# Patient Record
Sex: Female | Born: 1985 | Race: White | Hispanic: No | Marital: Married | Smoking: Current every day smoker
Health system: Southern US, Community
[De-identification: ages and names within clinical notes are randomized; demographics above are authoritative.]

## PROBLEM LIST (undated history)

## (undated) ENCOUNTER — Inpatient Hospital Stay (HOSPITAL_COMMUNITY): Payer: Self-pay

## (undated) DIAGNOSIS — L732 Hidradenitis suppurativa: Secondary | ICD-10-CM

## (undated) DIAGNOSIS — Z8619 Personal history of other infectious and parasitic diseases: Secondary | ICD-10-CM

## (undated) DIAGNOSIS — R51 Headache: Secondary | ICD-10-CM

## (undated) DIAGNOSIS — E119 Type 2 diabetes mellitus without complications: Secondary | ICD-10-CM

## (undated) DIAGNOSIS — Z789 Other specified health status: Secondary | ICD-10-CM

## (undated) DIAGNOSIS — Z9889 Other specified postprocedural states: Secondary | ICD-10-CM

## (undated) DIAGNOSIS — R112 Nausea with vomiting, unspecified: Secondary | ICD-10-CM

---

## 2013-05-29 ENCOUNTER — Ambulatory Visit: Payer: Self-pay | Admitting: Obstetrics

## 2013-12-05 ENCOUNTER — Emergency Department (HOSPITAL_COMMUNITY)
Admission: EM | Admit: 2013-12-05 | Discharge: 2013-12-05 | Disposition: A | Discharge status: Home / Self Care | Payer: Medicaid Other | Attending: Emergency Medicine | Admitting: Emergency Medicine

## 2013-12-05 ENCOUNTER — Encounter (HOSPITAL_COMMUNITY): Payer: Self-pay | Admitting: Emergency Medicine

## 2013-12-05 DIAGNOSIS — Z880 Allergy status to penicillin: Secondary | ICD-10-CM | POA: Insufficient documentation

## 2013-12-05 DIAGNOSIS — R6884 Jaw pain: Secondary | ICD-10-CM

## 2013-12-05 DIAGNOSIS — F17200 Nicotine dependence, unspecified, uncomplicated: Secondary | ICD-10-CM | POA: Insufficient documentation

## 2013-12-05 DIAGNOSIS — Z79899 Other long term (current) drug therapy: Secondary | ICD-10-CM | POA: Insufficient documentation

## 2013-12-05 NOTE — Discharge Instructions (Signed)
°  Patients with Medicaid: [facility] °[address], [phone] °[address], [phone] ° °If unable to pay, or uninsured, contact [facility] ([phone]) or [facility] ([phone] in [place], [phone] in [place]) to become qualified for the adult dental clinic ° °Other Low-Cost Community Dental Services: °[facility]- [address], [place], [place], [address] °   [phone], Ext. [phone] °   2nd and 4th Thursday of the month at 6:30am °   10 clients each day by appointment, can sometimes see walk-in patients if someone does not show for an appointment °[facility]- [address], [place], [place], [address] °   [phone] °[facility]- [address], [place], [place], [address] °   [phone] ° °[facility]- [phone] °[facility]- [phone] °[facility]- [phone] ° °

## 2013-12-05 NOTE — ED Provider Notes (Signed)
CSN: [id number]     Arrival date & time 12/05/13  1609 History  This chart was scribed for non-physician practitioner Junius Finner, PA-C working with Geoffery Lyons, MD by Joaquin Music, ED Scribe. This patient was seen in room TR05C/TR05C and the patient's care was started at 4:30 PM .   Chief Complaint  Patient presents with  . Jaw Pain   The history is provided by the patient. No language interpreter was used.   HPI Comments: Peggy Clark is a 28 y.o. female who presents to the Emergency Department complaining of lower R jaw pain that began 3 days ago. Pt states she suspects she may have "clinched her teeth too hard" in her sleep. She states she suspects she may have dislocated her jaw due to hearing a popping sound when opening and closing mouth. Pt states her pain exasperates when she clinches her mouth down. She states her pain is 10/10 when clinching down and states the pain is sharp. She states she has tried OTC Ibuprofen without relief. Pt denies having a dentist and states "she has not been to the dentist in a few years". Pt denies any prior injuries to jaw prior to pain. Pt denies any recent injuries.  Pt states she is currently 8 weeks and 2 days pregnant and concerned for child if X-ray is needed.  History reviewed. No pertinent past medical history. History reviewed. No pertinent past surgical history. No family history on file. History  Substance Use Topics  . Smoking status: Current Every Day Smoker  . Smokeless tobacco: Not on file  . Alcohol Use: No   OB History   Grav Para Term Preterm Abortions TAB SAB Ect Mult Living   1              Review of Systems  HENT:       R sided jaw pain  All other systems reviewed and are negative.    Allergies  Amoxicillin and Penicillins  Home Medications   Current Outpatient Rx  Name  Route  Sig  Dispense  Refill  . Prenatal Vit-Fe Fumarate-FA (PRENATAL MULTIVITAMIN) TABS tablet   Oral   Take 1 tablet by mouth  daily at 12 noon.           BP 132/64  Pulse 79  Temp(Src) 97.7 F (36.5 C) (Oral)  Resp 24  Ht 5\' 5"  (1.651 m)  Wt 126 lb (57.153 kg)  BMI 20.97 kg/m2  SpO2 100%  Physical Exam  Nursing note and vitals reviewed. Constitutional: She is oriented to person, place, and time. She appears well-developed and well-nourished.  HENT:  Head: Normocephalic and atraumatic.    Nose: Nose normal.  Mouth/Throat: Uvula is midline, oropharynx is clear and moist and mucous membranes are normal.  Tenderness over TMJ. Able to open and close mouth fully. No obvious dental abnormality or abscess.   Eyes: EOM are normal.  Neck: Normal range of motion.  Cardiovascular: Normal rate.   Pulmonary/Chest: Effort normal.  Musculoskeletal: Normal range of motion.  Neurological: She is alert and oriented to person, place, and time.  Skin: Skin is warm and dry.  Psychiatric: She has a normal mood and affect. Her behavior is normal.    ED Course  Procedures  DIAGNOSTIC STUDIES: Oxygen Saturation is 100% on RA, normal by my interpretation.    COORDINATION OF CARE: 4:33 PM-Discussed treatment plan which includes discharge pt with Tylenol. Pt agreed to plan.   Labs Review Labs Reviewed - No data  to display Imaging Review No results found.  EKG Interpretation   None      MDM   1. Jaw pain    Pt presenting with right sided jaw pain. Admits to grinding her teeth. Denies direct trauma such as a fall or being hit in the jaw. Has not f/u with dentist. Has tried ibuprofen w/o much relief. On exam, jaw does not appear dislocated. Discussed pt with Dr. Judd Lienelo, no imaging necessary at this time. Advised pt to use acetaminophen and f/u with dentist. Dental guide provided. Return precautions provided. Pt verbalized understanding and agreement with tx plan.   I personally performed the services described in this documentation, which was scribed in my presence. The recorded information has been reviewed and  is accurate.    Junius Finnerrin O'Malley, PA-C 12/05/13 1713

## 2013-12-05 NOTE — ED Notes (Signed)
The pt is c/o rt jaw pain for 4 days.  She thinks she  Grinded her teeth and dislocated her jaw.  Not able to move her jaw as usual

## 2013-12-05 NOTE — ED Provider Notes (Signed)
Medical screening examination/treatment/procedure(s) were performed by non-physician practitioner and as supervising physician I was immediately available for consultation/collaboration.     Geoffery Lyonsouglas Samul Mcinroy, MD 12/05/13 [phone]

## 2013-12-05 NOTE — ED Notes (Signed)
She is a few weeks preg

## 2013-12-16 LAB — OB RESULTS CONSOLE HIV ANTIBODY (ROUTINE TESTING): HIV: NONREACTIVE

## 2013-12-16 LAB — OB RESULTS CONSOLE ANTIBODY SCREEN: Antibody Screen: NEGATIVE

## 2013-12-16 LAB — OB RESULTS CONSOLE ABO/RH: RH Type: POSITIVE

## 2013-12-16 LAB — OB RESULTS CONSOLE RUBELLA ANTIBODY, IGM: Rubella: IMMUNE

## 2013-12-16 LAB — OB RESULTS CONSOLE HEPATITIS B SURFACE ANTIGEN: HEP B S AG: NEGATIVE

## 2013-12-16 LAB — OB RESULTS CONSOLE GC/CHLAMYDIA
Chlamydia: NEGATIVE
GC PROBE AMP, GENITAL: NEGATIVE

## 2014-04-08 LAB — OB RESULTS CONSOLE RPR: RPR: NONREACTIVE

## 2014-04-08 LAB — OB RESULTS CONSOLE HIV ANTIBODY (ROUTINE TESTING): HIV: NONREACTIVE

## 2014-04-29 ENCOUNTER — Encounter (HOSPITAL_COMMUNITY): Payer: Self-pay | Admitting: Emergency Medicine

## 2014-04-29 ENCOUNTER — Emergency Department (HOSPITAL_COMMUNITY)
Admission: EM | Admit: 2014-04-29 | Discharge: 2014-04-29 | Discharge status: Against Medical Advice | Payer: Medicaid Other | Attending: Emergency Medicine | Admitting: Emergency Medicine

## 2014-04-29 DIAGNOSIS — O99330 Smoking (tobacco) complicating pregnancy, unspecified trimester: Secondary | ICD-10-CM | POA: Insufficient documentation

## 2014-04-29 DIAGNOSIS — O9989 Other specified diseases and conditions complicating pregnancy, childbirth and the puerperium: Secondary | ICD-10-CM | POA: Insufficient documentation

## 2014-04-29 DIAGNOSIS — R109 Unspecified abdominal pain: Secondary | ICD-10-CM | POA: Insufficient documentation

## 2014-04-29 LAB — CBC WITH DIFFERENTIAL/PLATELET
BASOS PCT: 0 % (ref 0–1)
Basophils Absolute: 0 10*3/uL (ref 0.0–0.1)
Eosinophils Absolute: 0.1 10*3/uL (ref 0.0–0.7)
Eosinophils Relative: 1 % (ref 0–5)
HEMATOCRIT: 30 % — AB (ref 36.0–46.0)
HEMOGLOBIN: 10.3 g/dL — AB (ref 12.0–15.0)
Lymphocytes Relative: 26 % (ref 12–46)
Lymphs Abs: 3.1 10*3/uL (ref 0.7–4.0)
MCH: 32 pg (ref 26.0–34.0)
MCHC: 34.3 g/dL (ref 30.0–36.0)
MCV: 93.2 fL (ref 78.0–100.0)
MONO ABS: 0.7 10*3/uL (ref 0.1–1.0)
MONOS PCT: 6 % (ref 3–12)
Neutro Abs: 7.9 10*3/uL — ABNORMAL HIGH (ref 1.7–7.7)
Neutrophils Relative %: 67 % (ref 43–77)
Platelets: 300 10*3/uL (ref 150–400)
RBC: 3.22 MIL/uL — ABNORMAL LOW (ref 3.87–5.11)
RDW: 12.5 % (ref 11.5–15.5)
WBC: 11.8 10*3/uL — ABNORMAL HIGH (ref 4.0–10.5)

## 2014-04-29 LAB — COMPREHENSIVE METABOLIC PANEL
ALT: 9 U/L (ref 0–35)
AST: 14 U/L (ref 0–37)
Albumin: 3 g/dL — ABNORMAL LOW (ref 3.5–5.2)
Alkaline Phosphatase: 84 U/L (ref 39–117)
BUN: 5 mg/dL — AB (ref 6–23)
CHLORIDE: 100 meq/L (ref 96–112)
CO2: 21 mEq/L (ref 19–32)
CREATININE: 0.39 mg/dL — AB (ref 0.50–1.10)
Calcium: 9.5 mg/dL (ref 8.4–10.5)
Glucose, Bld: 81 mg/dL (ref 70–99)
Potassium: 3.8 mEq/L (ref 3.7–5.3)
Sodium: 136 mEq/L — ABNORMAL LOW (ref 137–147)
Total Protein: 6.9 g/dL (ref 6.0–8.3)

## 2014-04-29 LAB — URINE MICROSCOPIC-ADD ON

## 2014-04-29 LAB — URINALYSIS, ROUTINE W REFLEX MICROSCOPIC
Bilirubin Urine: NEGATIVE
Glucose, UA: NEGATIVE mg/dL
Ketones, ur: NEGATIVE mg/dL
LEUKOCYTES UA: NEGATIVE
Nitrite: NEGATIVE
PH: 7 (ref 5.0–8.0)
Protein, ur: NEGATIVE mg/dL
Specific Gravity, Urine: 1.01 (ref 1.005–1.030)
Urobilinogen, UA: 0.2 mg/dL (ref 0.0–1.0)

## 2014-04-29 LAB — LIPASE, BLOOD: LIPASE: 15 U/L (ref 11–59)

## 2014-04-29 NOTE — ED Notes (Signed)
Pt states that she has been having abdominal pain for 3 weeks. Pt states she is [redacted] weeks pregnant and she had a c-section prior to this pregnancy. Pt asked 2 different MD's about pain and was told that it was c-section scar pain. Pt havign pain around umbilicus today constant all day. Pt denies problems with bowels or urine.

## 2014-04-29 NOTE — ED Notes (Signed)
Pt's husband brought pager and BP cuff to nurse first and states they are leaving due to wait.  Encouraged husband for pt to stay to be evaluated and he declined.

## 2014-05-02 ENCOUNTER — Encounter (HOSPITAL_COMMUNITY): Payer: Self-pay | Admitting: *Deleted

## 2014-05-02 ENCOUNTER — Inpatient Hospital Stay (HOSPITAL_COMMUNITY)
Admission: AD | Admit: 2014-05-02 | Discharge: 2014-05-02 | Disposition: A | Discharge status: Home / Self Care | Payer: Medicaid Other | Source: Ambulatory Visit | Attending: Obstetrics and Gynecology | Admitting: Obstetrics and Gynecology

## 2014-05-02 DIAGNOSIS — O9989 Other specified diseases and conditions complicating pregnancy, childbirth and the puerperium: Principal | ICD-10-CM

## 2014-05-02 DIAGNOSIS — R109 Unspecified abdominal pain: Secondary | ICD-10-CM | POA: Insufficient documentation

## 2014-05-02 DIAGNOSIS — O99891 Other specified diseases and conditions complicating pregnancy: Secondary | ICD-10-CM | POA: Insufficient documentation

## 2014-05-02 DIAGNOSIS — O99330 Smoking (tobacco) complicating pregnancy, unspecified trimester: Secondary | ICD-10-CM | POA: Insufficient documentation

## 2014-05-02 LAB — URINE MICROSCOPIC-ADD ON

## 2014-05-02 LAB — URINALYSIS, ROUTINE W REFLEX MICROSCOPIC
BILIRUBIN URINE: NEGATIVE
GLUCOSE, UA: NEGATIVE mg/dL
KETONES UR: NEGATIVE mg/dL
Leukocytes, UA: NEGATIVE
Nitrite: NEGATIVE
PH: 7 (ref 5.0–8.0)
Protein, ur: 100 mg/dL — AB
Specific Gravity, Urine: 1.02 (ref 1.005–1.030)
Urobilinogen, UA: 1 mg/dL (ref 0.0–1.0)

## 2014-05-02 NOTE — Discharge Instructions (Signed)
Abdominal Pain During Pregnancy °Belly (abdominal) pain is common during pregnancy. Most of the time, it is not a serious problem. Other times, it can be a sign that something is wrong with the pregnancy. Always tell your doctor if you have belly pain. °HOME CARE °Monitor your belly pain for any changes. The following actions may help you feel better: °· Do not have sex (intercourse) or put anything in your vagina until you feel better. °· Rest until your pain stops. °· Drink clear fluids if you feel sick to your stomach (nauseous). Do not eat solid food until you feel better. °· Only take medicine as told by your doctor. °· Keep all doctor visits as told. °GET HELP RIGHT AWAY IF:  °· You are bleeding, leaking fluid, or pieces of tissue come out of your vagina. °· You have more pain or cramping. °· You keep throwing up (vomiting). °· You have pain when you pee (urinate) or have blood in your pee. °· You have a fever. °· You do not feel your baby moving as much. °· You feel very weak or feel like passing out. °· You have trouble breathing, with or without belly pain. °· You have a very bad headache and belly pain. °· You have fluid leaking from your vagina and belly pain. °· You keep having watery poop (diarrhea). °· Your belly pain does not go away after resting, or the pain gets worse. °MAKE SURE YOU:  °· Understand these instructions. °· Will watch your condition. °· Will get help right away if you are not doing well or get worse. °Document Released: 10/18/2009 Document Revised: 07/02/2013 Document Reviewed: 05/29/2013 °ExitCare® Patient Information ©2015 [employer]. This information is not intended to replace advice given to you by your health care provider. Make sure you discuss any questions you have with your health care provider. ° °

## 2014-05-02 NOTE — MAU Note (Signed)
I've had severe pain R of belly button for 3wks. Went to [facility] 3 days ago but did not wait to be seen. Mentioned to them at office but no clear answer as to cause.

## 2014-05-02 NOTE — MAU Provider Note (Signed)
History     CSN: [id number]  Arrival date and time: 05/02/14 2047   First Provider Initiated Contact with Patient 05/02/14 2204      Chief Complaint  Patient presents with  . Abdominal Pain   Abdominal Pain Pertinent negatives include no constipation, diarrhea, dysuria, fever, myalgias, nausea or vomiting.    28 yo G2P0101 at 4264w3d wks IUP here with report of Isevere pain R of belly button for 3wks. Pain is described as constant, with worsening at moments.  Typically last 15 minutes when it's at it's peak.  Se[facility] at [facility] 3 days ago but did not wait to be seen. Mentioned to them at office but no clear answer as to cause. Denis vaginal bleeding or leaking of fluid.     History reviewed. No pertinent past medical history.  Past Surgical History  Procedure Laterality Date  . Cesarean section      History reviewed. No pertinent family history.  History  Substance Use Topics  . Smoking status: Current Every Day Smoker  . Smokeless tobacco: Not on file  . Alcohol Use: No    Allergies:  Allergies  Allergen Reactions  . Amoxicillin Anaphylaxis  . Penicillins Anaphylaxis    Prescriptions prior to admission  Medication Sig Dispense Refill  . calcium carbonate (TUMS - DOSED IN MG ELEMENTAL CALCIUM) 500 MG chewable tablet Chew 2 tablets by mouth daily as needed for indigestion or heartburn.      . Prenatal Vit-Fe Fumarate-FA (PRENATAL MULTIVITAMIN) TABS tablet Take 1 tablet by mouth daily.        Review of Systems  Constitutional: Negative for fever and chills.  Gastrointestinal: Positive for abdominal pain. Negative for nausea, vomiting, diarrhea and constipation.  Genitourinary: Negative for dysuria, urgency and flank pain.  Musculoskeletal: Negative for myalgias.  All other systems reviewed and are negative.  Physical Exam   Blood pressure 145/72, pulse 99, temperature 98.9 F (37.2 C), resp. rate 20, height 5\' 5"  (1.651 m), weight 70.398 kg (155 lb 3.2  oz).  Physical Exam  Constitutional: She is oriented to person, place, and time. She appears well-developed and well-nourished. No distress.  HENT:  Head: Normocephalic.  Neck: Normal range of motion. Neck supple.  Cardiovascular: Normal rate, regular rhythm and normal heart sounds.   Respiratory: Effort normal and breath sounds normal.  GI: Soft. She exhibits no mass. There is no tenderness. No hernia. Hernia confirmed negative in the ventral area.    Genitourinary: No bleeding around the vagina.  Musculoskeletal: Normal range of motion.  Neurological: She is alert and oriented to person, place, and time.  Skin: Skin is warm and dry.   FHR 130's, +accels Toco - none  MAU Course  Procedures  Results for orders placed during the hospital encounter of 05/02/14 (from the past 24 hour(s))  URINALYSIS, ROUTINE W REFLEX MICROSCOPIC     Status: Abnormal   Collection Time    05/02/14  9:10 PM      Result Value Ref Range   Color, Urine YELLOW  YELLOW   APPearance CLEAR  CLEAR   Specific Gravity, Urine 1.020  1.005 - 1.030   pH 7.0  5.0 - 8.0   Glucose, UA NEGATIVE  NEGATIVE mg/dL   Hgb urine dipstick SMALL (*) NEGATIVE   Bilirubin Urine NEGATIVE  NEGATIVE   Ketones, ur NEGATIVE  NEGATIVE mg/dL   Protein, ur 366100 (*) NEGATIVE mg/dL   Urobilinogen, UA 1.0  0.0 - 1.0 mg/dL   Nitrite NEGATIVE  NEGATIVE   Leukocytes, UA NEGATIVE  NEGATIVE  URINE MICROSCOPIC-ADD ON     Status: Abnormal   Collection Time    05/02/14  9:10 PM      Result Value Ref Range   Squamous Epithelial / LPF FEW (*) RARE   WBC, UA 3-6  <3 WBC/hpf   RBC / HPF 3-6  <3 RBC/hpf   Bacteria, UA MANY (*) RARE   Urine-Other MUCOUS PRESENT     2240 Consulted with Dr. Forestine Naeichardson > reviewed vitals, HPI/exam/OB hx > discharge home with reassurance  Assessment and Plan   28 yo G2P0101 at 2244w3d wks IUP Abdominal Wall Pain  Plan: Discharge to home Provide reassurance Follow-up in office  prn [facility],Avielle Clark 05/02/2014, 10:07 PM

## 2014-05-23 ENCOUNTER — Encounter (HOSPITAL_COMMUNITY): Payer: Self-pay | Admitting: *Deleted

## 2014-05-23 ENCOUNTER — Inpatient Hospital Stay (HOSPITAL_COMMUNITY)
Admission: AD | Admit: 2014-05-23 | Discharge: 2014-05-23 | Disposition: A | Discharge status: Home / Self Care | Payer: Medicaid Other | Source: Ambulatory Visit | Attending: Obstetrics and Gynecology | Admitting: Obstetrics and Gynecology

## 2014-05-23 DIAGNOSIS — O4700 False labor before 37 completed weeks of gestation, unspecified trimester: Secondary | ICD-10-CM | POA: Diagnosis not present

## 2014-05-23 DIAGNOSIS — N858 Other specified noninflammatory disorders of uterus: Secondary | ICD-10-CM

## 2014-05-23 DIAGNOSIS — N859 Noninflammatory disorder of uterus, unspecified: Secondary | ICD-10-CM | POA: Diagnosis not present

## 2014-05-23 HISTORY — DX: Other specified health status: Z78.9

## 2014-05-23 LAB — URINALYSIS, ROUTINE W REFLEX MICROSCOPIC
Bilirubin Urine: NEGATIVE
Glucose, UA: NEGATIVE mg/dL
Ketones, ur: NEGATIVE mg/dL
Leukocytes, UA: NEGATIVE
Nitrite: NEGATIVE
PROTEIN: 100 mg/dL — AB
Specific Gravity, Urine: 1.025 (ref 1.005–1.030)
Urobilinogen, UA: 0.2 mg/dL (ref 0.0–1.0)
pH: 6 (ref 5.0–8.0)

## 2014-05-23 LAB — URINE MICROSCOPIC-ADD ON

## 2014-05-23 LAB — FETAL FIBRONECTIN: FETAL FIBRONECTIN: NEGATIVE

## 2014-05-23 NOTE — MAU Note (Signed)
Patient presents with complaint of contractions 6 minutes apart since 1900 today and states her mucus plug fell out 3 days ago.

## 2014-05-23 NOTE — MAU Provider Note (Signed)
History     CSN: [id number]  Arrival date and time: 05/23/14 2024   None     Chief Complaint  Patient presents with  . Abdominal Pain   HPI This is a 28 y.o. female at 1513w3d who presents with c/o contractions. States mucous plug came out several days ago. No leaking or bleeding now.   RN Note;  Patient presents with complaint of contractions 6 minutes apart since 1900 today and states her mucus plug fell out 3 days ago.       OB History   Grav Para Term Preterm Abortions TAB SAB Ect Mult Living   2 1  1      1       Past Medical History  Diagnosis Date  . Medical history non-contributory     Past Surgical History  Procedure Laterality Date  . Cesarean section      History reviewed. No pertinent family history.  History  Substance Use Topics  . Smoking status: Current Every Day Smoker  . Smokeless tobacco: Current User     Comment: Vapor cigarettes  . Alcohol Use: No    Allergies:  Allergies  Allergen Reactions  . Amoxicillin Anaphylaxis  . Penicillins Anaphylaxis    Prescriptions prior to admission  Medication Sig Dispense Refill  . calcium carbonate (TUMS - DOSED IN MG ELEMENTAL CALCIUM) 500 MG chewable tablet Chew 2 tablets by mouth daily as needed for indigestion or heartburn.      . Prenatal Vit-Fe Fumarate-FA (PRENATAL MULTIVITAMIN) TABS tablet Take 1 tablet by mouth daily.        Review of Systems  Constitutional: Negative for fever, chills and malaise/fatigue.  Gastrointestinal: Positive for abdominal pain. Negative for nausea, vomiting, diarrhea and constipation.  Neurological: Negative for dizziness.   Physical Exam   Blood pressure 120/67, pulse 95, temperature 98.6 F (37 C), temperature source Oral, resp. rate 18, height 5' 1.5" (1.562 m), weight 71.725 kg (158 lb 2 oz).  Physical Exam  Constitutional: She is oriented to person, place, and time. She appears well-developed and well-nourished. No distress.  HENT:  Head:  Normocephalic.  Cardiovascular: Normal rate.   Respiratory: Effort normal.  GI: Soft. She exhibits no distension and no mass. There is no tenderness. There is no rebound and no guarding.  Genitourinary: Vagina normal. No vaginal discharge found.  Dilation: Closed Effacement (%): Thick Cervical Position: Posterior Station: -3 Exam by:: M.Kiwana Deblasi,CNM   Musculoskeletal: Normal range of motion.  Neurological: She is alert and oriented to person, place, and time.  Skin: Skin is warm and dry.  Psychiatric: She has a normal mood and affect.  FHR reactive, Cat I SOme uterine irritability noted, no contractions FFn sent  MAU Course  Procedures  MDM Results for orders placed during the hospital encounter of 05/23/14 (from the past 72 hour(s))  URINALYSIS, ROUTINE W REFLEX MICROSCOPIC     Status: Abnormal   Collection Time    05/23/14  8:30 PM      Result Value Ref Range   Color, Urine YELLOW  YELLOW   APPearance CLEAR  CLEAR   Specific Gravity, Urine 1.025  1.005 - 1.030   pH 6.0  5.0 - 8.0   Glucose, UA NEGATIVE  NEGATIVE mg/dL   Hgb urine dipstick MODERATE (*) NEGATIVE   Bilirubin Urine NEGATIVE  NEGATIVE   Ketones, ur NEGATIVE  NEGATIVE mg/dL   Protein, ur 696100 (*) NEGATIVE mg/dL   Urobilinogen, UA 0.2  0.0 - 1.0 mg/dL  Nitrite NEGATIVE  NEGATIVE   Leukocytes, UA NEGATIVE  NEGATIVE  URINE MICROSCOPIC-ADD ON     Status: Abnormal   Collection Time    05/23/14  8:30 PM      Result Value Ref Range   Squamous Epithelial / LPF FEW (*) RARE   WBC, UA 0-2  <3 WBC/hpf   RBC / HPF 0-2  <3 RBC/hpf   Bacteria, UA FEW (*) RARE   Urine-Other MUCOUS PRESENT    FETAL FIBRONECTIN     Status: None   Collection Time    05/23/14  9:10 PM      Result Value Ref Range   Fetal Fibronectin NEGATIVE  NEGATIVE     Assessment and Plan  A:  SIUP at [redacted]w[redacted]d       Uterine irritability      No change in cervix      Negative Fetal Fibronectin  P:  Discussed with Dr Senaida Ores       Discharged  home       PTL precautions  [facility] 05/23/2014, 9:01 PM

## 2014-05-23 NOTE — Discharge Instructions (Signed)

## 2014-06-07 ENCOUNTER — Inpatient Hospital Stay (HOSPITAL_COMMUNITY)
Admission: AD | Admit: 2014-06-07 | Discharge: 2014-06-07 | Disposition: A | Discharge status: Home / Self Care | Payer: Medicaid Other | Source: Ambulatory Visit | Attending: Obstetrics and Gynecology | Admitting: Obstetrics and Gynecology

## 2014-06-07 ENCOUNTER — Encounter (HOSPITAL_COMMUNITY): Payer: Self-pay | Admitting: *Deleted

## 2014-06-07 DIAGNOSIS — O4700 False labor before 37 completed weeks of gestation, unspecified trimester: Secondary | ICD-10-CM | POA: Diagnosis not present

## 2014-06-07 DIAGNOSIS — O99330 Smoking (tobacco) complicating pregnancy, unspecified trimester: Secondary | ICD-10-CM | POA: Insufficient documentation

## 2014-06-07 DIAGNOSIS — O99891 Other specified diseases and conditions complicating pregnancy: Secondary | ICD-10-CM | POA: Diagnosis present

## 2014-06-07 DIAGNOSIS — O4703 False labor before 37 completed weeks of gestation, third trimester: Secondary | ICD-10-CM

## 2014-06-07 LAB — COMPREHENSIVE METABOLIC PANEL
ALBUMIN: 2.5 g/dL — AB (ref 3.5–5.2)
ALK PHOS: 114 U/L (ref 39–117)
ALT: 9 U/L (ref 0–35)
AST: 15 U/L (ref 0–37)
Anion gap: 12 (ref 5–15)
BUN: 6 mg/dL (ref 6–23)
CALCIUM: 8.9 mg/dL (ref 8.4–10.5)
CO2: 24 mEq/L (ref 19–32)
CREATININE: 0.46 mg/dL — AB (ref 0.50–1.10)
Chloride: 102 mEq/L (ref 96–112)
GFR calc Af Amer: >90 mL/min (ref >90)
GLUCOSE: 74 mg/dL (ref 70–99)
POTASSIUM: 3.7 meq/L (ref 3.7–5.3)
Sodium: 138 mEq/L (ref 137–147)
Total Bilirubin: <0.2 mg/dL — ABNORMAL LOW (ref 0.3–1.2)
Total Protein: 5.9 g/dL — ABNORMAL LOW (ref 6.0–8.3)

## 2014-06-07 LAB — URINALYSIS, ROUTINE W REFLEX MICROSCOPIC
Bilirubin Urine: NEGATIVE
GLUCOSE, UA: 100 mg/dL — AB
Ketones, ur: 15 mg/dL — AB
LEUKOCYTES UA: NEGATIVE
Nitrite: NEGATIVE
Protein, ur: 100 mg/dL — AB
Specific Gravity, Urine: 1.025 (ref 1.005–1.030)
UROBILINOGEN UA: 1 mg/dL (ref 0.0–1.0)
pH: 6 (ref 5.0–8.0)

## 2014-06-07 LAB — CBC
HEMATOCRIT: 31.5 % — AB (ref 36.0–46.0)
HEMOGLOBIN: 10.8 g/dL — AB (ref 12.0–15.0)
MCH: 32.6 pg (ref 26.0–34.0)
MCHC: 34.3 g/dL (ref 30.0–36.0)
MCV: 95.2 fL (ref 78.0–100.0)
Platelets: 289 10*3/uL (ref 150–400)
RBC: 3.31 MIL/uL — ABNORMAL LOW (ref 3.87–5.11)
RDW: 12.8 % (ref 11.5–15.5)
WBC: 11.5 10*3/uL — ABNORMAL HIGH (ref 4.0–10.5)

## 2014-06-07 LAB — URINE MICROSCOPIC-ADD ON

## 2014-06-07 LAB — URIC ACID: Uric Acid, Serum: 5.6 mg/dL (ref 2.4–7.0)

## 2014-06-07 LAB — LACTATE DEHYDROGENASE: LDH: 156 U/L (ref 94–250)

## 2014-06-07 LAB — AMNISURE RUPTURE OF MEMBRANE (ROM) NOT AT ARMC: Amnisure ROM: NEGATIVE

## 2014-06-07 NOTE — Discharge Instructions (Signed)
Reasons to return to MAU:  1.  Contractions are  5 minutes apart or less, each last 1 minute, these have been going on for 1-2 hours, and you cannot walk or talk during them 2.  You have a large gush of fluid, or a trickle of fluid that will not stop and you have to wear a pad 3.  You have bleeding that is bright red, heavier than spotting--like menstrual bleeding (spotting can be normal in early labor or after a check of your cervix) 4.  You do not feel the baby moving like he/she normally does 5.  Severe headache, pain in your upper right abdomen, or sudden changes to your vision

## 2014-06-07 NOTE — MAU Provider Note (Signed)
Chief Complaint:  Rupture of Membranes   First Provider Initiated Contact with Patient 06/07/14 2033      HPI: Peggy Clark is a 28 y.o. G2P0101 at [redacted]w[redacted]d who presents to maternity admissions reporting leakage of fluid since last night.  She reports she has felt a slow trickle several times since last night, enough to wet her underwear but not requiring a pad or getting onto her clothes.  She also reports intercourse this morning.  She reports good fetal movement, denies regular contractions, vaginal bleeding, vaginal itching/burning, urinary symptoms, h/a, visual disturbances, epigastric pain, dizziness, n/v, or fever/chills.     Past Medical History: Past Medical History  Diagnosis Date  . Medical history non-contributory     Past obstetric history: OB History  Gravida Para Term Preterm AB SAB TAB Ectopic Multiple Living  2 1  1      1     # Outcome Date GA Lbr Len/2nd Weight Sex Delivery Anes PTL Lv  2 CUR           1 PRE 10/16/05 [redacted]w[redacted]d  2.296 kg (5 lb 1 oz) M LTCS   Y      Past Surgical History: Past Surgical History  Procedure Laterality Date  . Cesarean section      Family History: History reviewed. No pertinent family history.  Social History: History  Substance Use Topics  . Smoking status: Current Every Day Smoker  . Smokeless tobacco: Never Used     Comment: Vapor cigarettes  . Alcohol Use: No    Allergies:  Allergies  Allergen Reactions  . Amoxicillin Anaphylaxis  . Penicillins Anaphylaxis    Meds:  No prescriptions prior to admission    ROS: Pertinent findings in history of present illness.  Physical Exam  Blood pressure 131/74, pulse 91, temperature 98.4 F (36.9 C), temperature source Oral, resp. rate 18, height 5' 1.5" (1.562 m), weight 72.576 kg (160 lb), SpO2 100.00%. GENERAL: Well-developed, well-nourished female in no acute distress.  HEENT: normocephalic HEART: normal rate RESP: normal effort ABDOMEN: Soft, non-tender, gravid appropriate  for gestational age EXTREMITIES: Nontender, no edema NEURO: alert and oriented SPECULUM EXAM: Cervix visually closed/thick, negative pooling with cough/valsalva, Amnisure collected   Dilation: Closed Effacement (%): Thick Cervical Position: Posterior (slightly) Station: [employer] Exam by:: Dellie Burns, RN BSN  FHT:  Baseline 145, moderate variability, accelerations present, no decelerations Contractions: None on toco or to palpation   Labs: Results for orders placed during the hospital encounter of 06/07/14 (from the past 24 hour(s))  URINALYSIS, ROUTINE W REFLEX MICROSCOPIC     Status: Abnormal   Collection Time    06/07/14  7:59 PM      Result Value Ref Range   Color, Urine YELLOW  YELLOW   APPearance CLEAR  CLEAR   Specific Gravity, Urine 1.025  1.005 - 1.030   pH 6.0  5.0 - 8.0   Glucose, UA 100 (*) NEGATIVE mg/dL   Hgb urine dipstick MODERATE (*) NEGATIVE   Bilirubin Urine NEGATIVE  NEGATIVE   Ketones, ur 15 (*) NEGATIVE mg/dL   Protein, ur 478 (*) NEGATIVE mg/dL   Urobilinogen, UA 1.0  0.0 - 1.0 mg/dL   Nitrite NEGATIVE  NEGATIVE   Leukocytes, UA NEGATIVE  NEGATIVE  URINE MICROSCOPIC-ADD ON     Status: Abnormal   Collection Time    06/07/14  7:59 PM      Result Value Ref Range   Squamous Epithelial / LPF FEW (*) RARE   WBC, UA  0-2  <3 WBC/hpf   RBC / HPF 3-6  <3 RBC/hpf   Bacteria, UA FEW (*) RARE  AMNISURE RUPTURE OF MEMBRANE (ROM)     Status: None   Collection Time    06/07/14  8:38 PM      Result Value Ref Range   Amnisure ROM NEGATIVE    CBC     Status: Abnormal   Collection Time    06/07/14  9:12 PM      Result Value Ref Range   WBC 11.5 (*) 4.0 - 10.5 K/uL   RBC 3.31 (*) 3.87 - 5.11 MIL/uL   Hemoglobin 10.8 (*) 12.0 - 15.0 g/dL   HCT 16.131.5 (*) 09.636.0 - 04.546.0 %   MCV 95.2  78.0 - 100.0 fL   MCH 32.6  26.0 - 34.0 pg   MCHC 34.3  30.0 - 36.0 g/dL   RDW 40.912.8  81.111.5 - 91.415.5 %   Platelets 289  150 - 400 K/uL  COMPREHENSIVE METABOLIC PANEL     Status:  Abnormal   Collection Time    06/07/14  9:12 PM      Result Value Ref Range   Sodium 138  137 - 147 mEq/L   Potassium 3.7  3.7 - 5.3 mEq/L   Chloride 102  96 - 112 mEq/L   CO2 24  19 - 32 mEq/L   Glucose, Bld 74  70 - 99 mg/dL   BUN 6  6 - 23 mg/dL   Creatinine, Ser 7.820.46 (*) 0.50 - 1.10 mg/dL   Calcium 8.9  8.4 - 95.610.5 mg/dL   Total Protein 5.9 (*) 6.0 - 8.3 g/dL   Albumin 2.5 (*) 3.5 - 5.2 g/dL   AST 15  0 - 37 U/L   ALT 9  0 - 35 U/L   Alkaline Phosphatase 114  39 - 117 U/L   Total Bilirubin <0.2 (*) 0.3 - 1.2 mg/dL   GFR calc non Af Amer >90  >90 mL/min   GFR calc Af Amer >90  >90 mL/min   Anion gap 12  5 - 15  URIC ACID     Status: None   Collection Time    06/07/14  9:12 PM      Result Value Ref Range   Uric Acid, Serum 5.6  2.4 - 7.0 mg/dL  LACTATE DEHYDROGENASE     Status: None   Collection Time    06/07/14  9:12 PM      Result Value Ref Range   LDH 156  94 - 250 U/L    Assessment: 1. Threatened preterm labor, third trimester     Plan: Consult Dr Ambrose MantleHenley Discharge home Labor precautions and fetal kick counts Preeclampsia precautions     Follow-up Information   Follow up with Bing PlumeHENLEY,THOMAS F, MD. (As scheduled)    Specialty:  Obstetrics and Gynecology   Contact information:   [address], [address] [place] [phone]        Medication List         calcium carbonate 500 MG chewable tablet  Commonly known as:  TUMS - dosed in mg elemental calcium  Chew 2 tablets by mouth daily as needed for indigestion or heartburn.     diphenhydrAMINE 25 MG tablet  Commonly known as:  BENADRYL  Take 25 mg by mouth every 6 (six) hours as needed for allergies.     prenatal multivitamin Tabs tablet  Take 1 tablet by mouth daily.  Sharen Counter Certified Nurse-Midwife 06/07/2014 11:01 PM

## 2014-06-07 NOTE — MAU Note (Signed)
Pt reports leaking fluid since 7pm last night. Denies pain.

## 2014-06-15 LAB — OB RESULTS CONSOLE GBS: STREP GROUP B AG: POSITIVE

## 2014-06-20 ENCOUNTER — Inpatient Hospital Stay (HOSPITAL_COMMUNITY)
Admission: AD | Admit: 2014-06-20 | Discharge: 2014-06-20 | Disposition: A | Discharge status: Home / Self Care | Payer: Medicaid Other | Source: Ambulatory Visit | Attending: Obstetrics and Gynecology | Admitting: Obstetrics and Gynecology

## 2014-06-20 ENCOUNTER — Encounter (HOSPITAL_COMMUNITY): Payer: Self-pay

## 2014-06-20 DIAGNOSIS — O368190 Decreased fetal movements, unspecified trimester, not applicable or unspecified: Secondary | ICD-10-CM | POA: Insufficient documentation

## 2014-06-20 DIAGNOSIS — O368131 Decreased fetal movements, third trimester, fetus 1: Secondary | ICD-10-CM

## 2014-06-20 DIAGNOSIS — O99330 Smoking (tobacco) complicating pregnancy, unspecified trimester: Secondary | ICD-10-CM | POA: Insufficient documentation

## 2014-06-20 LAB — URINE MICROSCOPIC-ADD ON

## 2014-06-20 LAB — URINALYSIS, ROUTINE W REFLEX MICROSCOPIC
BILIRUBIN URINE: NEGATIVE
Glucose, UA: NEGATIVE mg/dL
Ketones, ur: NEGATIVE mg/dL
Leukocytes, UA: NEGATIVE
Nitrite: NEGATIVE
PH: 7 (ref 5.0–8.0)
Protein, ur: 30 mg/dL — AB
SPECIFIC GRAVITY, URINE: 1.015 (ref 1.005–1.030)
Urobilinogen, UA: 0.2 mg/dL (ref 0.0–1.0)

## 2014-06-20 NOTE — MAU Note (Signed)
Pt states here for decreased FM. Felt FM 6-7 times today. FHT's 150 in triage.

## 2014-06-20 NOTE — MAU Provider Note (Signed)
History     CSN: [id number]  Arrival date and time: 06/20/14 1605   First Provider Initiated Contact with Patient 06/20/14 1649      Chief Complaint  Patient presents with  . Decreased Fetal Movement   HPI Peggy Clark 28 y.o. [redacted]w[redacted]d  Comes to MAU with decreased FM today.  Baby was moving very well yesterday and she was having some contractions yesterday.  No contractions today, but movement is less.  Client is a smoker.    OB History   Grav Para Term Preterm Abortions TAB SAB Ect Mult Living   2 1  1      1       Past Medical History  Diagnosis Date  . Medical history non-contributory     Past Surgical History  Procedure Laterality Date  . Cesarean section      History reviewed. No pertinent family history.  History  Substance Use Topics  . Smoking status: Current Every Day Smoker  . Smokeless tobacco: Never Used     Comment: Vapor cigarettes  . Alcohol Use: No    Allergies:  Allergies  Allergen Reactions  . Amoxicillin Anaphylaxis  . Penicillins Anaphylaxis    Prescriptions prior to admission  Medication Sig Dispense Refill  . acetaminophen (TYLENOL) 325 MG tablet Take 650 mg by mouth every 6 (six) hours as needed for mild pain.      . calcium carbonate (TUMS - DOSED IN MG ELEMENTAL CALCIUM) 500 MG chewable tablet Chew 2 tablets by mouth daily as needed for indigestion or heartburn.      . diphenhydrAMINE (BENADRYL) 25 MG tablet Take 25 mg by mouth every 6 (six) hours as needed for allergies.      . Prenatal Vit-Fe Fumarate-FA (PRENATAL MULTIVITAMIN) TABS tablet Take 1 tablet by mouth daily.        Review of Systems  Gastrointestinal: Negative for nausea, vomiting and abdominal pain.  Genitourinary:       No vaginal discharge. No vaginal bleeding. No dysuria. Decreased fetal movement   Physical Exam   Blood pressure 128/87, pulse 94, temperature 98 F (36.7 C), temperature source Oral, resp. rate 18, height 5\' 2"  (1.575 m), weight 162 lb 6 oz  (73.653 kg).  Physical Exam  Nursing note and vitals reviewed. Constitutional: She is oriented to person, place, and time. She appears well-developed and well-nourished.  Can smell smoke when standing near to client.  HENT:  Head: Normocephalic.  Eyes: EOM are normal.  Neck: Neck supple.  GI: Soft. There is no tenderness.  On fetal monitor, no contractions palpated or noted on the monitor strip.  FHT baseline 135 with moderate variability and 15x15 accels noted.  Reactive strip.  Client has felt the baby move since she has been here today.  Musculoskeletal: Normal range of motion.  Neurological: She is alert and oriented to person, place, and time.  Skin: Skin is warm and dry.  Psychiatric: She has a normal mood and affect.    MAU Course  Procedures  MDM Results for orders placed during the hospital encounter of 06/20/14 (from the past 24 hour(s))  URINALYSIS, ROUTINE W REFLEX MICROSCOPIC     Status: Abnormal   Collection Time    06/20/14  4:11 PM      Result Value Ref Range   Color, Urine YELLOW  YELLOW   APPearance CLOUDY (*) CLEAR   Specific Gravity, Urine 1.015  1.005 - 1.030   pH 7.0  5.0 - 8.0   Glucose,  UA NEGATIVE  NEGATIVE mg/dL   Hgb urine dipstick MODERATE (*) NEGATIVE   Bilirubin Urine NEGATIVE  NEGATIVE   Ketones, ur NEGATIVE  NEGATIVE mg/dL   Protein, ur 30 (*) NEGATIVE mg/dL   Urobilinogen, UA 0.2  0.0 - 1.0 mg/dL   Nitrite NEGATIVE  NEGATIVE   Leukocytes, UA NEGATIVE  NEGATIVE  URINE MICROSCOPIC-ADD ON     Status: Abnormal   Collection Time    06/20/14  4:11 PM      Result Value Ref Range   Squamous Epithelial / LPF FEW (*) RARE   WBC, UA 0-2  <3 WBC/hpf   RBC / HPF 3-6  <3 RBC/hpf   Bacteria, UA FEW (*) RARE   Urine-Other AMORPHOUS URATES/PHOSPHATES     [id number]  Consult with Dr. Jackelyn KnifeMeisinger re: plan of care.  Assessment and Plan  Decreased fetal movement  Plan Reactive NST noted today. Advised to stop all smoking. Keep the appointment you have in  the office on Tuesday. Urine culture pending due to Moderate Hemoglobin in urine.  BURLESON,TERRI 06/20/2014, 4:53 PM

## 2014-06-20 NOTE — Discharge Instructions (Signed)
Keep your appointment in the office on Tuesday. Drink at least 8 8-oz glasses of water every day. Fetal kick counts.  Call your doctor if your baby is not moving. Decrease smoking or for the best for you and your baby, do not smoke.

## 2014-06-22 LAB — URINE CULTURE

## 2014-06-22 NOTE — Progress Notes (Signed)
FHT from 8-8 reviewed.  Reactive NST.

## 2014-07-07 ENCOUNTER — Encounter (HOSPITAL_COMMUNITY): Payer: Self-pay | Admitting: Pharmacist

## 2014-07-16 ENCOUNTER — Encounter (HOSPITAL_COMMUNITY)
Admission: RE | Admit: 2014-07-16 | Discharge: 2014-07-16 | Disposition: A | Discharge status: Home / Self Care | Payer: Medicaid Other | Source: Ambulatory Visit | Attending: Obstetrics and Gynecology | Admitting: Obstetrics and Gynecology

## 2014-07-16 ENCOUNTER — Encounter (HOSPITAL_COMMUNITY): Payer: Self-pay

## 2014-07-16 HISTORY — DX: Nausea with vomiting, unspecified: R11.2

## 2014-07-16 HISTORY — DX: Other specified postprocedural states: Z98.890

## 2014-07-16 HISTORY — DX: Personal history of other infectious and parasitic diseases: Z86.19

## 2014-07-16 HISTORY — DX: Hidradenitis suppurativa: L73.2

## 2014-07-16 HISTORY — DX: Type 2 diabetes mellitus without complications: E11.9

## 2014-07-16 HISTORY — DX: Headache: R51

## 2014-07-16 LAB — TYPE AND SCREEN
ABO/RH(D): O POS
Antibody Screen: NEGATIVE

## 2014-07-16 LAB — CBC
HCT: 36.6 % (ref 36.0–46.0)
HEMOGLOBIN: 12.7 g/dL (ref 12.0–15.0)
MCH: 32.9 pg (ref 26.0–34.0)
MCHC: 34.7 g/dL (ref 30.0–36.0)
MCV: 94.8 fL (ref 78.0–100.0)
Platelets: 251 10*3/uL (ref 150–400)
RBC: 3.86 MIL/uL — AB (ref 3.87–5.11)
RDW: 13.1 % (ref 11.5–15.5)
WBC: 11.2 10*3/uL — ABNORMAL HIGH (ref 4.0–10.5)

## 2014-07-16 LAB — RPR

## 2014-07-16 LAB — ABO/RH: ABO/RH(D): O POS

## 2014-07-16 MED ORDER — GENTAMICIN SULFATE 40 MG/ML IJ SOLN
INTRAVENOUS | Status: AC
  Administered 2014-07-17: 113.75 mL via INTRAVENOUS
  Filled 2014-07-16: qty 7.75

## 2014-07-16 NOTE — Patient Instructions (Signed)
Your procedure is scheduled on: Tomorrow, July 17, 2014  Enter through the [employer] of [facility] at: 9:30 a.m.  Pick up the phone at the desk and dial [phone].  Call this number if you have problems the morning of surgery: [phone].  Remember: Do NOT eat food: AFTER MIDNIGHT TONIGHT Do NOT drink clear liquids after: AFTER MIDNIGHT TONIGHT Take these medicines the morning of surgery with a SIP OF WATER: NONE  Do NOT wear jewelry (body piercing), metal hair clips/bobby pins, or nail polish. Do NOT wear lotions, powders, or perfumes.  You may wear deoderant. Do NOT shave for 48 hours prior to surgery. Do NOT bring valuables to the hospital.  Leave suitcase in car.  After surgery it may be brought to your room.  For patients admitted to the hospital, checkout time is 11:00 AM the day of discharge.

## 2014-07-17 ENCOUNTER — Inpatient Hospital Stay (HOSPITAL_COMMUNITY): Payer: Medicaid Other | Admitting: Anesthesiology

## 2014-07-17 ENCOUNTER — Encounter (HOSPITAL_COMMUNITY): Payer: Self-pay | Admitting: Registered Nurse

## 2014-07-17 ENCOUNTER — Encounter (HOSPITAL_COMMUNITY): Payer: Medicaid Other | Admitting: Anesthesiology

## 2014-07-17 ENCOUNTER — Inpatient Hospital Stay (HOSPITAL_COMMUNITY)
Admission: RE | Admit: 2014-07-17 | Discharge: 2014-07-19 | DRG: 766 | Disposition: A | Discharge status: Home / Self Care | Payer: Medicaid Other | Source: Ambulatory Visit | Attending: Obstetrics and Gynecology | Admitting: Obstetrics and Gynecology

## 2014-07-17 ENCOUNTER — Encounter (HOSPITAL_COMMUNITY)
Admission: RE | Disposition: A | Discharge status: Home / Self Care | Payer: Self-pay | Source: Ambulatory Visit | Attending: Obstetrics and Gynecology

## 2014-07-17 DIAGNOSIS — O99892 Other specified diseases and conditions complicating childbirth: Secondary | ICD-10-CM | POA: Diagnosis present

## 2014-07-17 DIAGNOSIS — Z98891 History of uterine scar from previous surgery: Secondary | ICD-10-CM

## 2014-07-17 DIAGNOSIS — O34219 Maternal care for unspecified type scar from previous cesarean delivery: Secondary | ICD-10-CM | POA: Diagnosis not present

## 2014-07-17 DIAGNOSIS — O99334 Smoking (tobacco) complicating childbirth: Secondary | ICD-10-CM | POA: Diagnosis not present

## 2014-07-17 DIAGNOSIS — O9989 Other specified diseases and conditions complicating pregnancy, childbirth and the puerperium: Secondary | ICD-10-CM

## 2014-07-17 DIAGNOSIS — Z22330 Carrier of Group B streptococcus: Secondary | ICD-10-CM | POA: Diagnosis not present

## 2014-07-17 SURGERY — Surgical Case
Anesthesia: Spinal

## 2014-07-17 MED ORDER — ONDANSETRON HCL 4 MG/2ML IJ SOLN
INTRAMUSCULAR | Status: DC | PRN
  Administered 2014-07-17: 4 mg via INTRAVENOUS

## 2014-07-17 MED ORDER — DEXAMETHASONE SODIUM PHOSPHATE 4 MG/ML IJ SOLN
INTRAMUSCULAR | Status: DC | PRN
  Administered 2014-07-17: 4 mg via INTRAVENOUS

## 2014-07-17 MED ORDER — LACTATED RINGERS IV SOLN: INTRAVENOUS | Status: DC

## 2014-07-17 MED ORDER — PHENYLEPHRINE 8 MG IN D5W 100 ML (0.08MG/ML) PREMIX OPTIME
INJECTION | INTRAVENOUS | Status: DC | PRN
  Administered 2014-07-17: 60 ug/min via INTRAVENOUS

## 2014-07-17 MED ORDER — DIPHENHYDRAMINE HCL 50 MG/ML IJ SOLN: 12.5000 mg | INTRAMUSCULAR | Status: DC | PRN

## 2014-07-17 MED ORDER — SCOPOLAMINE 1 MG/3DAYS TD PT72: 1.0000 | MEDICATED_PATCH | Freq: Once | TRANSDERMAL | Status: DC

## 2014-07-17 MED ORDER — MORPHINE SULFATE (PF) 0.5 MG/ML IJ SOLN
INTRAMUSCULAR | Status: DC | PRN
  Administered 2014-07-17: .15 mg via INTRATHECAL

## 2014-07-17 MED ORDER — MENTHOL 3 MG MT LOZG: 1.0000 | LOZENGE | OROMUCOSAL | Status: DC | PRN

## 2014-07-17 MED ORDER — LACTATED RINGERS IV SOLN
INTRAVENOUS | Status: DC
  Administered 2014-07-17 (×3): via INTRAVENOUS

## 2014-07-17 MED ORDER — ACETAMINOPHEN 500 MG PO TABS: 1000.0000 mg | ORAL_TABLET | Freq: Four times a day (QID) | ORAL | Status: AC

## 2014-07-17 MED ORDER — DIBUCAINE 1 % RE OINT: 1.0000 "application " | TOPICAL_OINTMENT | RECTAL | Status: DC | PRN

## 2014-07-17 MED ORDER — MEPERIDINE HCL 25 MG/ML IJ SOLN: 6.2500 mg | INTRAMUSCULAR | Status: DC | PRN

## 2014-07-17 MED ORDER — MORPHINE SULFATE 0.5 MG/ML IJ SOLN
INTRAMUSCULAR | Status: AC
  Filled 2014-07-17: qty 10

## 2014-07-17 MED ORDER — NALOXONE HCL 0.4 MG/ML IJ SOLN: 0.4000 mg | INTRAMUSCULAR | Status: DC | PRN

## 2014-07-17 MED ORDER — IBUPROFEN 600 MG PO TABS
600.0000 mg | ORAL_TABLET | Freq: Four times a day (QID) | ORAL | Status: DC
  Administered 2014-07-17 – 2014-07-19 (×8): 600 mg via ORAL
  Filled 2014-07-17 (×8): qty 1

## 2014-07-17 MED ORDER — TETANUS-DIPHTH-ACELL PERTUSSIS 5-2.5-18.5 LF-MCG/0.5 IM SUSP: 0.5000 mL | Freq: Once | INTRAMUSCULAR | Status: DC

## 2014-07-17 MED ORDER — MAGNESIUM HYDROXIDE 400 MG/5ML PO SUSP: 30.0000 mL | ORAL | Status: DC | PRN

## 2014-07-17 MED ORDER — DEXAMETHASONE SODIUM PHOSPHATE 4 MG/ML IJ SOLN
INTRAMUSCULAR | Status: AC
  Filled 2014-07-17: qty 1

## 2014-07-17 MED ORDER — FENTANYL CITRATE 0.05 MG/ML IJ SOLN
INTRAMUSCULAR | Status: AC
  Filled 2014-07-17: qty 2

## 2014-07-17 MED ORDER — DIPHENHYDRAMINE HCL 25 MG PO CAPS
25.0000 mg | ORAL_CAPSULE | Freq: Four times a day (QID) | ORAL | Status: DC | PRN
  Administered 2014-07-17: 25 mg via ORAL

## 2014-07-17 MED ORDER — WITCH HAZEL-GLYCERIN EX PADS: 1.0000 "application " | MEDICATED_PAD | CUTANEOUS | Status: DC | PRN

## 2014-07-17 MED ORDER — KETOROLAC TROMETHAMINE 30 MG/ML IJ SOLN
INTRAMUSCULAR | Status: AC
  Filled 2014-07-17: qty 1

## 2014-07-17 MED ORDER — DIPHENHYDRAMINE HCL 25 MG PO CAPS
25.0000 mg | ORAL_CAPSULE | ORAL | Status: DC | PRN
  Administered 2014-07-18: 25 mg via ORAL
  Filled 2014-07-17 (×3): qty 1

## 2014-07-17 MED ORDER — ONDANSETRON HCL 4 MG/2ML IJ SOLN
INTRAMUSCULAR | Status: AC
  Filled 2014-07-17: qty 2

## 2014-07-17 MED ORDER — OXYCODONE-ACETAMINOPHEN 5-325 MG PO TABS
1.0000 | ORAL_TABLET | ORAL | Status: DC | PRN
  Administered 2014-07-18 – 2014-07-19 (×2): 1 via ORAL
  Filled 2014-07-17 (×3): qty 1

## 2014-07-17 MED ORDER — OXYTOCIN 40 UNITS IN LACTATED RINGERS INFUSION - SIMPLE MED: 62.5000 mL/h | INTRAVENOUS | Status: AC

## 2014-07-17 MED ORDER — NALBUPHINE HCL 10 MG/ML IJ SOLN: 5.0000 mg | INTRAMUSCULAR | Status: DC | PRN

## 2014-07-17 MED ORDER — 0.9 % SODIUM CHLORIDE (POUR BTL) OPTIME
TOPICAL | Status: DC | PRN
  Administered 2014-07-17: 1000 mL

## 2014-07-17 MED ORDER — DIPHENHYDRAMINE HCL 50 MG/ML IJ SOLN: 25.0000 mg | INTRAMUSCULAR | Status: DC | PRN

## 2014-07-17 MED ORDER — KETOROLAC TROMETHAMINE 30 MG/ML IJ SOLN: 30.0000 mg | Freq: Four times a day (QID) | INTRAMUSCULAR | Status: AC | PRN

## 2014-07-17 MED ORDER — PHENYLEPHRINE 8 MG IN D5W 100 ML (0.08MG/ML) PREMIX OPTIME
INJECTION | INTRAVENOUS | Status: AC
  Filled 2014-07-17: qty 100

## 2014-07-17 MED ORDER — LANOLIN HYDROUS EX OINT: 1.0000 "application " | TOPICAL_OINTMENT | CUTANEOUS | Status: DC | PRN

## 2014-07-17 MED ORDER — MIDAZOLAM HCL 2 MG/2ML IJ SOLN: 0.5000 mg | Freq: Once | INTRAMUSCULAR | Status: DC | PRN

## 2014-07-17 MED ORDER — IBUPROFEN 600 MG PO TABS: 600.0000 mg | ORAL_TABLET | Freq: Four times a day (QID) | ORAL | Status: DC | PRN

## 2014-07-17 MED ORDER — LACTATED RINGERS IV SOLN
Freq: Once | INTRAVENOUS | Status: AC
  Administered 2014-07-17: 10:00:00 via INTRAVENOUS

## 2014-07-17 MED ORDER — FENTANYL CITRATE 0.05 MG/ML IJ SOLN
INTRAMUSCULAR | Status: DC | PRN
  Administered 2014-07-17: 25 ug via INTRATHECAL

## 2014-07-17 MED ORDER — OXYTOCIN 10 UNIT/ML IJ SOLN
40.0000 [IU] | INTRAVENOUS | Status: DC | PRN
  Administered 2014-07-17: 40 [IU] via INTRAVENOUS

## 2014-07-17 MED ORDER — OXYTOCIN 10 UNIT/ML IJ SOLN
INTRAMUSCULAR | Status: AC
  Filled 2014-07-17: qty 4

## 2014-07-17 MED ORDER — METOCLOPRAMIDE HCL 5 MG/ML IJ SOLN: 10.0000 mg | Freq: Three times a day (TID) | INTRAMUSCULAR | Status: DC | PRN

## 2014-07-17 MED ORDER — MEASLES, MUMPS & RUBELLA VAC ~~LOC~~ INJ
0.5000 mL | INJECTION | Freq: Once | SUBCUTANEOUS | Status: DC
  Filled 2014-07-17: qty 0.5

## 2014-07-17 MED ORDER — SIMETHICONE 80 MG PO CHEW
80.0000 mg | CHEWABLE_TABLET | ORAL | Status: DC | PRN
  Administered 2014-07-17 – 2014-07-18 (×2): 80 mg via ORAL
  Filled 2014-07-17 (×2): qty 1

## 2014-07-17 MED ORDER — SCOPOLAMINE 1 MG/3DAYS TD PT72
MEDICATED_PATCH | TRANSDERMAL | Status: AC
  Administered 2014-07-17: 1.5 mg
  Filled 2014-07-17: qty 1

## 2014-07-17 MED ORDER — BUPIVACAINE IN DEXTROSE 0.75-8.25 % IT SOLN
INTRATHECAL | Status: DC | PRN
  Administered 2014-07-17: 11 mg via INTRATHECAL

## 2014-07-17 MED ORDER — ONDANSETRON HCL 4 MG/2ML IJ SOLN: 4.0000 mg | INTRAMUSCULAR | Status: DC | PRN

## 2014-07-17 MED ORDER — NALOXONE HCL 1 MG/ML IJ SOLN
1.0000 ug/kg/h | INTRAVENOUS | Status: DC | PRN
  Filled 2014-07-17: qty 2

## 2014-07-17 MED ORDER — PRENATAL MULTIVITAMIN CH
1.0000 | ORAL_TABLET | Freq: Every day | ORAL | Status: DC
  Administered 2014-07-18 – 2014-07-19 (×2): 1 via ORAL
  Filled 2014-07-17 (×2): qty 1

## 2014-07-17 MED ORDER — PROMETHAZINE HCL 25 MG/ML IJ SOLN: 6.2500 mg | INTRAMUSCULAR | Status: DC | PRN

## 2014-07-17 MED ORDER — NALBUPHINE HCL 10 MG/ML IJ SOLN
INTRAMUSCULAR | Status: AC
  Filled 2014-07-17: qty 1

## 2014-07-17 MED ORDER — SODIUM CHLORIDE 0.9 % IJ SOLN: 3.0000 mL | INTRAMUSCULAR | Status: DC | PRN

## 2014-07-17 MED ORDER — NALBUPHINE HCL 10 MG/ML IJ SOLN
5.0000 mg | INTRAMUSCULAR | Status: DC | PRN
  Administered 2014-07-17: 10 mg via SUBCUTANEOUS

## 2014-07-17 MED ORDER — SENNOSIDES-DOCUSATE SODIUM 8.6-50 MG PO TABS
2.0000 | ORAL_TABLET | ORAL | Status: DC
  Administered 2014-07-17 – 2014-07-18 (×2): 2 via ORAL
  Filled 2014-07-17 (×2): qty 2

## 2014-07-17 MED ORDER — ZOLPIDEM TARTRATE 5 MG PO TABS
5.0000 mg | ORAL_TABLET | Freq: Every evening | ORAL | Status: DC | PRN
Start: 2014-07-17 — End: 2014-07-19

## 2014-07-17 MED ORDER — ONDANSETRON HCL 4 MG PO TABS
4.0000 mg | ORAL_TABLET | ORAL | Status: DC | PRN
Start: 2014-07-17 — End: 2014-07-19

## 2014-07-17 MED ORDER — FENTANYL CITRATE 0.05 MG/ML IJ SOLN: 25.0000 ug | INTRAMUSCULAR | Status: DC | PRN

## 2014-07-17 MED ORDER — LACTATED RINGERS IV SOLN
INTRAVENOUS | Status: DC | PRN
  Administered 2014-07-17: 12:00:00 via INTRAVENOUS

## 2014-07-17 MED ORDER — ONDANSETRON HCL 4 MG/2ML IJ SOLN: 4.0000 mg | Freq: Three times a day (TID) | INTRAMUSCULAR | Status: DC | PRN

## 2014-07-17 SURGICAL SUPPLY — 35 items
BENZOIN TINCTURE PRP APPL 2/3 (GAUZE/BANDAGES/DRESSINGS) ×3 IMPLANT
BLADE SURG 10 STRL SS (BLADE) ×6 IMPLANT
CLAMP CORD UMBIL (MISCELLANEOUS) IMPLANT
CLOSURE WOUND 1/2 X4 (GAUZE/BANDAGES/DRESSINGS) ×1
CLOTH BEACON ORANGE TIMEOUT ST (SAFETY) ×3 IMPLANT
CONTAINER PREFILL 10% NBF 15ML (MISCELLANEOUS) IMPLANT
DRAPE LG THREE QUARTER DISP (DRAPES) IMPLANT
DRSG OPSITE POSTOP 4X10 (GAUZE/BANDAGES/DRESSINGS) ×3 IMPLANT
DURAPREP 26ML APPLICATOR (WOUND CARE) ×3 IMPLANT
ELECT REM PT RETURN 9FT ADLT (ELECTROSURGICAL) ×3
ELECTRODE REM PT RTRN 9FT ADLT (ELECTROSURGICAL) ×1 IMPLANT
EXTRACTOR VACUUM KIWI (MISCELLANEOUS) IMPLANT
EXTRACTOR VACUUM M CUP 4 TUBE (SUCTIONS) IMPLANT
EXTRACTOR VACUUM M CUP 4' TUBE (SUCTIONS)
GLOVE ORTHO TXT STRL SZ7.5 (GLOVE) ×3 IMPLANT
GOWN STRL REUS W/TWL LRG LVL3 (GOWN DISPOSABLE) ×6 IMPLANT
KIT ABG SYR 3ML LUER SLIP (SYRINGE) IMPLANT
NEEDLE HYPO 25X5/8 SAFETYGLIDE (NEEDLE) ×3 IMPLANT
NS IRRIG 1000ML POUR BTL (IV SOLUTION) ×3 IMPLANT
PACK C SECTION WH (CUSTOM PROCEDURE TRAY) ×3 IMPLANT
PAD OB MATERNITY 4.3X12.25 (PERSONAL CARE ITEMS) ×3 IMPLANT
RTRCTR C-SECT PINK 25CM LRG (MISCELLANEOUS) ×3 IMPLANT
STAPLER VISISTAT 35W (STAPLE) IMPLANT
STRIP CLOSURE SKIN 1/2X4 (GAUZE/BANDAGES/DRESSINGS) ×2 IMPLANT
SUT CHROMIC 1 CTX 36 (SUTURE) ×6 IMPLANT
SUT PLAIN 0 NONE (SUTURE) IMPLANT
SUT PLAIN 2 0 XLH (SUTURE) IMPLANT
SUT VIC AB 0 CT1 27 (SUTURE) ×4
SUT VIC AB 0 CT1 27XBRD ANBCTR (SUTURE) ×2 IMPLANT
SUT VIC AB 2-0 CT1 27 (SUTURE) ×2
SUT VIC AB 2-0 CT1 TAPERPNT 27 (SUTURE) ×1 IMPLANT
SUT VIC AB 4-0 KS 27 (SUTURE) ×3 IMPLANT
TOWEL OR 17X24 6PK STRL BLUE (TOWEL DISPOSABLE) ×3 IMPLANT
TRAY FOLEY CATH 14FR (SET/KITS/TRAYS/PACK) ×3 IMPLANT
WATER STERILE IRR 1000ML POUR (IV SOLUTION) ×3 IMPLANT

## 2014-07-17 NOTE — Interval H&P Note (Signed)
History and Physical Interval Note:  07/17/2014 10:48 AM  Peggy Clark  has presented today for surgery, with the diagnosis of Repeat C/Section  The various methods of treatment have been discussed with the patient and family. After consideration of risks, benefits and other options for treatment, the patient has consented to  Procedure(s): CESAREAN SECTION (N/A) as a surgical intervention .  The patient's history has been reviewed, patient examined, no change in status, stable for surgery.  I have reviewed the patient's chart and labs.  Questions were answered to the patient's satisfaction.     [name]

## 2014-07-17 NOTE — Op Note (Signed)
Preoperative diagnosis: Intrauterine pregnancy at 40 weeks, previous cesarean section Postoperative diagnosis: Same Procedure: Repeat low transverse cesarean section without extensions Surgeon: Lavina Hamman M.D. Anesthesia: Spinal Findings: Patient had normal gravid anatomy and delivered a viable female infant with Apgars of 9 and 9 weight pending Estimated blood loss: 800 cc Specimens: Placenta sent to labor and delivery Complications: None  Procedure in detail: The patient was taken to the operating room and placed in the sitting position.  Dr. Brayton Caves instilled spinal anesthesia.  She was then placed in the dorsosupine position with left tilt. Abdomen was then prepped and draped in the usual sterile fashion, and a foley catheter was inserted. The level of her anesthesia was found to be adequate. Abdomen was entered via a standard Pfannenstiel incision through her previous scar. Once the peritoneal cavity was entered the Alexis disposable self-retaining retractor was placed and good visualization was achieved. A 4 cm transverse incision was then made in the lower uterine segment pushing the bladder inferior. Once the uterine cavity was entered the incision was extended digitally. The fetal vertex was grasped and delivered through the incision atraumatically. Mouth and nares were suctioned. The remainder of the infant then delivered atraumatically. Cord was doubly clamped and cut and the infant handed to the awaiting pediatric team. Cord blood was obtained. The placenta delivered spontaneously. Uterus was wiped dry with clean lap pad and all clots and debris were removed. Uterine incision was inspected and found to be free of extensions. Uterine incision was closed in 1 layer with running locking #1 Chromic. Bleeding from the right side was controlled with 2 figure 8 sutures of #1 Chromic.  Tubes and ovaries were inspected and found to be normal. Uterine incision was inspected and found to be  hemostatic. Bleeding from serosal edges was controlled with electrocautery. The Alexis retractor was removed. Subfascial space was irrigated and made hemostatic with electrocautery.  Omentum was freed from the peritoneal edges with the Bovie, peritoneum was then closed with running 2-0 Vicryl.  Fascia was closed in running fashion starting at both ends and meeting in the middle with 0 Vicryl. Subcutaneous tissue was then irrigated and made hemostatic with electrocautery. Skin was closed with running subcuticular 4-0 Vicryl  followed by steri-strips and a sterile dressing. Patient tolerated the procedure well and was taken to the recovery in stable condition. Counts were correct x2, she received Gent and Clinda IV at the beginning of the procedure and she had PAS hose on throughout the procedure.

## 2014-07-17 NOTE — Anesthesia Postprocedure Evaluation (Signed)
  Anesthesia Post-op Note  Anesthesia Post Note  Patient: Peggy Clark  Procedure(s) Performed: Procedure(s) (LRB): CESAREAN SECTION (N/A)  Anesthesia type: Spinal  Patient location: PACU  Post pain: Pain level controlled  Post assessment: Post-op Vital signs reviewed  Last Vitals:  Filed Vitals:   07/17/14 1355  BP: 142/81  Pulse: 54  Temp: 36.6 C  Resp: 18    Post vital signs: Reviewed  Level of consciousness: awake  Complications: No apparent anesthesia complications

## 2014-07-17 NOTE — Anesthesia Procedure Notes (Signed)
Spinal  Patient location during procedure: OR Start time: 07/17/2014 11:00 AM Staffing Anesthesiologist: Brayton Caves Performed by: anesthesiologist  Preanesthetic Checklist Completed: patient identified, site marked, surgical consent, pre-op evaluation, timeout performed, IV checked, risks and benefits discussed and monitors and equipment checked Spinal Block Patient position: sitting Prep: DuraPrep Patient monitoring: heart rate, cardiac monitor, continuous pulse ox and blood pressure Approach: midline Location: L3-4 Injection technique: single-shot Needle Needle type: Sprotte  Needle gauge: 24 G Needle length: 9 cm Assessment Sensory level: T4 Additional Notes Patient identified.  Risk benefits discussed including failed block, incomplete pain control, headache, nerve damage, paralysis, blood pressure changes, nausea, vomiting, reactions to medication both toxic or allergic, and postpartum back pain.  Patient expressed understanding and wished to proceed.  All questions were answered.  Sterile technique used throughout procedure.  CSF was clear.  No parasthesia or other complications.  Please see nursing notes for vital signs.

## 2014-07-17 NOTE — Progress Notes (Signed)
Patient has had 2 new IV's  dislodged in < 3 hours. Dr. Ambrose Mantle consulted for order to discontinue further iv placement. Updated on vitals, activity level, urine output and pain control. Plan to discontinue iv for now and will restart with elevated temperature or at MD request.

## 2014-07-17 NOTE — Transfer of Care (Signed)
Immediate Anesthesia Transfer of Care Note  Patient: Peggy Clark  Procedure(s) Performed: Procedure(s): CESAREAN SECTION (N/A)  Patient Location: PACU  Anesthesia Type:Spinal  Level of Consciousness: awake, alert  and oriented  Airway & Oxygen Therapy: Patient Spontanous Breathing  Post-op Assessment: Report given to PACU RN  Post vital signs: Reviewed  Complications: No apparent anesthesia complications

## 2014-07-17 NOTE — Anesthesia Preprocedure Evaluation (Addendum)
Anesthesia Evaluation  Patient identified by MRN, date of birth, ID band Patient awake    Reviewed: Allergy & Precautions, H&P , NPO status , Patient's Chart, lab work & pertinent test results  History of Anesthesia Complications (+) PONV and history of anesthetic complications  Airway Mallampati: II      Dental   Pulmonary Current Smoker,  breath sounds clear to auscultation        Cardiovascular Exercise Tolerance: Good Rhythm:regular Rate:Normal     Neuro/Psych  Headaches,    GI/Hepatic   Endo/Other    Renal/GU      Musculoskeletal   Abdominal   Peds  Hematology   Anesthesia Other Findings   Reproductive/Obstetrics (+) Pregnancy                          Anesthesia Physical Anesthesia Plan  ASA: III  Anesthesia Plan: Spinal   Post-op Pain Management:    Induction:   Airway Management Planned:   Additional Equipment:   Intra-op Plan:   Post-operative Plan:   Informed Consent: I have reviewed the patients History and Physical, chart, labs and discussed the procedure including the risks, benefits and alternatives for the proposed anesthesia with the patient or authorized representative who has indicated his/her understanding and acceptance.     Plan Discussed with: Anesthesiologist, CRNA and Surgeon  Anesthesia Plan Comments:         Anesthesia Quick Evaluation

## 2014-07-17 NOTE — Consult Note (Signed)
Neonatology Note:   Attendance at C-section:    I was asked by Dr. Jackelyn Knife to attend this repeat C/S at term. The mother is a G2P1002 pos, GBS positive. ROM at delivery, fluid clear. Nuchal cord x1. Infant vigorous with good spontaneous cry and tone. Needed only minimal bulb suctioning. Ap 9,9. Lungs clear to ausc in DR. To CN to care of Pediatrician.   Rosie Fate, MSN, NNP-BC

## 2014-07-17 NOTE — H&P (Signed)
Peggy Clark is a 28 y.o. female, G2 P88, EGA [redacted] weeks with EDC 9-2 presenting for repeat c-section.  Previous c-section at 36 weeks for oligo, wanted VBAC, but cervix is unfavorable for induction.  Prenatal care has been otherwise uncomplicated, see prenatal records for complete history.  Maternal Medical History:  Fetal activity: Perceived fetal activity is normal.    Prenatal complications: no prenatal complications   OB History   Grav Para Term Preterm Abortions TAB SAB Ect Mult Living   Past Medical History  Diagnosis Date  . Medical history non-contributory   . Diabetes mellitus without complication     GESTATIONAL  . Headache(784.0)     history of migraines no problem in 4 years  . History of surgical site infection   . PONV (postoperative nausea and vomiting)   . Hidradenitis suppurativa   Hgb AS   Past Surgical History  Procedure Laterality Date  . Cesarean section     Family History: family history is not on file. Social History:  reports that she has been smoking Cigarettes.  She has a 5 pack-year smoking history. She has never used smokeless tobacco. She reports that she does not drink alcohol or use illicit drugs.   Prenatal Transfer Tool  Maternal Diabetes: No Genetic Screening: Normal Maternal Ultrasounds/Referrals: Normal Fetal Ultrasounds or other Referrals:  None Maternal Substance Abuse:  No Significant Maternal Medications:  None Significant Maternal Lab Results:  Lab values include: Group B Strep positive Other Comments:  None  Review of Systems  Respiratory: Negative.   Cardiovascular: Negative.       Height  (1.6 m), weight 75.297 kg (166 lb), last menstrual period 10/08/2013. Maternal Exam:  Abdomen: Surgical scars: low transverse.   Estimated fetal weight is 7 1/2 lbs.   Fetal presentation: vertex  Introitus: Normal vulva. Normal vagina.    Physical Exam  Vitals reviewed. Constitutional: She appears  well-developed and well-nourished.  Neck: Neck supple. No thyromegaly present.  Cardiovascular: Normal rate, regular rhythm and normal heart sounds.   No murmur heard. Respiratory: Effort normal and breath sounds normal. No respiratory distress. She has no wheezes.  GI: Soft.    Prenatal labs: ABO, Rh: --/--/O POS, O POS (09/03 1610) Antibody: NEG (09/03 0948) Rubella: Immune (02/03 0000) RPR: NON REAC (09/03 0948)  HBsAg: Negative (02/03 0000)  HIV: Non-reactive (05/27 0000)  GBS: Positive (08/03 0000)  GCT:  174, GTT nl  Assessment/Plan: IUP at 40 weeks for repeat c-section.  The surgical procedure and risks have been discussed, will admit for repeat c-section.     Ariadna Setter D 07/17/2014, 8:31 AM

## 2014-07-18 LAB — CBC
HCT: 29.1 % — ABNORMAL LOW (ref 36.0–46.0)
Hemoglobin: 10 g/dL — ABNORMAL LOW (ref 12.0–15.0)
MCH: 32.4 pg (ref 26.0–34.0)
MCHC: 34 g/dL (ref 30.0–36.0)
MCV: 95.1 fL (ref 78.0–100.0)
PLATELETS: 194 10*3/uL (ref 150–400)
RBC: 3.06 MIL/uL — AB (ref 3.87–5.11)
RDW: 13.1 % (ref 11.5–15.5)
WBC: 15.4 10*3/uL — ABNORMAL HIGH (ref 4.0–10.5)

## 2014-07-18 NOTE — Progress Notes (Signed)
Patient ID: Peggy Clark, female   DOB: 1986-08-09, 28 y.o.   MRN: [id number] #1 afebrile BP normal Tolerating a regular diet, passing flatus and ambulating

## 2014-07-18 NOTE — Addendum Note (Signed)
Addendum created 07/18/14 1538 by Armanda Heritage, CRNA   Modules edited: Notes Section   Notes Section:  File: [id number]

## 2014-07-18 NOTE — Anesthesia Postprocedure Evaluation (Signed)
  Anesthesia Post-op Note  Patient: Peggy Clark  Procedure(s) Performed: Procedure(s): CESAREAN SECTION (N/A)  Patient Location: Mother/Baby  Anesthesia Type:Spinal  Level of Consciousness: awake, alert  and oriented  Airway and Oxygen Therapy: Patient Spontanous Breathing  Post-op Pain: mild  Post-op Assessment: Post-op Vital signs reviewed  Post-op Vital Signs: Reviewed and stable  Last Vitals:  Filed Vitals:   07/18/14 1054  BP: 141/73  Pulse: 57  Temp: 36.8 C  Resp: 18    Complications: No apparent anesthesia complications

## 2014-07-18 NOTE — Progress Notes (Signed)
Clinical Social Work Department PSYCHOSOCIAL ASSESSMENT - MATERNAL/CHILD 07/18/2014  Patient:  Peggy Clark, Peggy Clark  Account Number:  [id number]  Admit Date:  07/17/2014  Marjo Bicker Name:   Peggy Clark    Clinical Social Worker:  Mahmud Keithly, LCSW   Date/Time:  07/18/2014 11:00 AM  Date Referred:  07/17/2014   Referral source  Central Nursery     Referred reason  Depression/Anxiety   Other referral source:    I:  FAMILY / HOME ENVIRONMENT Child's legal guardian:  PARENT  Guardian - Name Guardian - Age Guardian - Address  Peggy Clark,Peggy Clark 28 [address]  [place], [place] [address]  Peggy Clark  same as above   Other household support members/support persons Other support:    II  PSYCHOSOCIAL DATA Information Source:    Event organiser Employment:   FOB is employed   Surveyor, quantity resources:   If Medicaid - County:   Other  [facility]  Food Stamps   School / Grade:   Maternity Care Coordinator / Child Services Coordination / Early Interventions:  Cultural issues impacting care:    III  STRENGTHS Strengths  Supportive family/friends  Home prepared for Child (including basic supplies)  Adequate Resources   Strength comment:    IV  RISK FACTORS AND CURRENT PROBLEMS Current Problem:       V  SOCIAL WORK ASSESSMENT Acknowledged order for Social Work consult to assess mother's history of depression.  Parents are married and have one other dependent age 19.  Informed that she has a hx of depression during her teenage years.   Informed that she was never put on medication, and never participated in therapy.   She denies any current symptoms of depression or anxiety.    Parents report adequate family support.   She denies any hx of substance abuse.  Spoke with mother about the signs/symptoms of PP Depression.  No acute social concerns related at this time.    Mother informed of social work availability      VI SOCIAL WORK PLAN Social Work Plan  No Further Intervention  Required / No Barriers to Discharge

## 2014-07-18 NOTE — Lactation Note (Addendum)
This note was copied from the chart of Peggy Azarie Coriz. Lactation Consultation Note: Mom reports this baby is latching much better than her first baby. Reports no pain with nursing.Mom pumped for 6 Peggy Clark with her first and reports good milk supply. Discussed cluster feeding and encouraged to take a nap this afternoon. No questions at present. BF brochure given with resources for support after DC. To call prn  Patient Name: Peggy Clark [id number] Date: 07/18/2014 Reason for consult: Initial assessment   Maternal Data Formula Feeding for Exclusion: No Does the patient have breastfeeding experience prior to this delivery?: Yes  Feeding   LATCH Score/Interventions                      Lactation Tools Discussed/Used     Consult Status Consult Status: PRN    Pamelia Hoit 07/18/2014, 10:59 AM

## 2014-07-19 MED ORDER — IBUPROFEN 600 MG PO TABS: 600.0000 mg | ORAL_TABLET | Freq: Four times a day (QID) | ORAL | Status: AC | PRN

## 2014-07-19 NOTE — Progress Notes (Signed)
Patient ID: Peggy Clark, female   DOB: 11/13/1986, 28 y.o.   MRN: [id number] #2 afebrile BP normal She is passing flatus, tolerating a diet, ambulating well and voiding well. She is ready for d/c

## 2014-07-19 NOTE — Discharge Summary (Signed)
[name], Peggy Clark                  ACCOUNT NO.:  [id number]  MEDICAL RECORD NO.:  [id number]  LOCATION:  9101                          FACILITY:  [facility]  PHYSICIAN:  Malachi Pro. Ambrose Mantle, M.D. DATE OF BIRTH:  Jul 04, 1986  DATE OF ADMISSION:  07/17/2014 DATE OF DISCHARGE:  07/19/2014                              DISCHARGE SUMMARY   This is a 28 year old female, para 0-1-0-1, gravida 2 at [redacted] weeks gestation with [facility] July 15, 2014, presented for repeat C-section. Her previous C-section had been done at 36 weeks for oligohydramnios. She wanted vaginal birth after cesarean, but the cervix is unfavorable for induction.  Blood group and type O positive, negative antibody, rubella immune, RPR nonreactive, hepatitis B surface antigen negative, HIV negative, group B strep positive.  One hour Glucola 174.  A 3-hour GTT normal.  The patient was admitted to the hospital and underwent a low-transverse cervical C-section by Dr. Jackelyn Knife without extensions. The female infant was delivered with Apgars of 9 and 9.  Uterine incision was closed in 1 layer.  Tubes and ovaries were normal.  Postpartum, the patient did quite well.  She rapidly resumed normal function including tolerating a diet, passing flatus, voiding well, and ambulating well. On the second postpartum day, she was ready for discharge.  Her initial hemoglobin was 12.7, hematocrit 36.6, white count 11,200, platelet count 251,000.  Followup hemoglobin was 10.0, hematocrit 29.1.  FINAL DIAGNOSES:  Intrauterine pregnancy at 40 weeks, delivered by C- section with prior history of C-section.  OPERATION:  Low-transverse cervical C-section.  FINAL CONDITION:  Improved.  INSTRUCTIONS:  Include our regular discharge instruction booklet as well as after visit summary, prescription for Motrin 600 mg 30 tablets, 1 every 6 hours as needed for pain.  The patient declines Percocet, she resumed Tylenol as needed and she is advised to return to the office  in 10 days for followup examination.  She is also advised to keep her dressing covered for 1 week and she may call Dr. Jackelyn Knife to find out his preference for keeping the incision covered.     Malachi Pro. Ambrose Mantle, M.D.     TFH/MEDQ  D:  07/19/2014  T:  07/19/2014  Job:  [id number]

## 2014-07-19 NOTE — Discharge Instructions (Signed)
booklet °

## 2014-07-20 ENCOUNTER — Encounter (HOSPITAL_COMMUNITY): Payer: Self-pay | Admitting: Obstetrics and Gynecology

## 2014-09-14 ENCOUNTER — Encounter (HOSPITAL_COMMUNITY): Payer: Self-pay | Admitting: Obstetrics and Gynecology

## 2014-10-29 ENCOUNTER — Encounter (HOSPITAL_COMMUNITY): Payer: Self-pay | Admitting: Emergency Medicine

## 2014-10-29 ENCOUNTER — Emergency Department (HOSPITAL_COMMUNITY): Payer: Medicaid Other

## 2014-10-29 ENCOUNTER — Emergency Department (HOSPITAL_COMMUNITY)
Admission: EM | Admit: 2014-10-29 | Discharge: 2014-10-29 | Disposition: A | Discharge status: Home / Self Care | Payer: Self-pay | Attending: Emergency Medicine | Admitting: Emergency Medicine

## 2014-10-29 DIAGNOSIS — R102 Pelvic and perineal pain: Secondary | ICD-10-CM

## 2014-10-29 DIAGNOSIS — Z872 Personal history of diseases of the skin and subcutaneous tissue: Secondary | ICD-10-CM | POA: Insufficient documentation

## 2014-10-29 DIAGNOSIS — E119 Type 2 diabetes mellitus without complications: Secondary | ICD-10-CM | POA: Insufficient documentation

## 2014-10-29 DIAGNOSIS — N83209 Unspecified ovarian cyst, unspecified side: Secondary | ICD-10-CM

## 2014-10-29 DIAGNOSIS — Z3202 Encounter for pregnancy test, result negative: Secondary | ICD-10-CM | POA: Insufficient documentation

## 2014-10-29 DIAGNOSIS — Z720 Tobacco use: Secondary | ICD-10-CM | POA: Insufficient documentation

## 2014-10-29 DIAGNOSIS — Z880 Allergy status to penicillin: Secondary | ICD-10-CM | POA: Insufficient documentation

## 2014-10-29 DIAGNOSIS — Z9889 Other specified postprocedural states: Secondary | ICD-10-CM | POA: Insufficient documentation

## 2014-10-29 DIAGNOSIS — N8320 Unspecified ovarian cysts: Secondary | ICD-10-CM | POA: Insufficient documentation

## 2014-10-29 LAB — COMPREHENSIVE METABOLIC PANEL
ALT: 32 U/L (ref 0–35)
AST: 22 U/L (ref 0–37)
Albumin: 4.1 g/dL (ref 3.5–5.2)
Alkaline Phosphatase: 103 U/L (ref 39–117)
Anion gap: 14 (ref 5–15)
BUN: 9 mg/dL (ref 6–23)
CALCIUM: 9.8 mg/dL (ref 8.4–10.5)
CHLORIDE: 102 meq/L (ref 96–112)
CO2: 24 mEq/L (ref 19–32)
CREATININE: 0.61 mg/dL (ref 0.50–1.10)
GFR calc Af Amer: >90 mL/min (ref >90)
GFR calc non Af Amer: >90 mL/min (ref >90)
Glucose, Bld: 138 mg/dL — ABNORMAL HIGH (ref 70–99)
Potassium: 3.4 mEq/L — ABNORMAL LOW (ref 3.7–5.3)
Sodium: 140 mEq/L (ref 137–147)
Total Bilirubin: 0.3 mg/dL (ref 0.3–1.2)
Total Protein: 7.7 g/dL (ref 6.0–8.3)

## 2014-10-29 LAB — URINE MICROSCOPIC-ADD ON

## 2014-10-29 LAB — URINALYSIS, ROUTINE W REFLEX MICROSCOPIC
Bilirubin Urine: NEGATIVE
GLUCOSE, UA: NEGATIVE mg/dL
Ketones, ur: 15 mg/dL — AB
LEUKOCYTES UA: NEGATIVE
NITRITE: NEGATIVE
PH: 8.5 — AB (ref 5.0–8.0)
Protein, ur: 30 mg/dL — AB
Specific Gravity, Urine: 1.017 (ref 1.005–1.030)
Urobilinogen, UA: 0.2 mg/dL (ref 0.0–1.0)

## 2014-10-29 LAB — WET PREP, GENITAL: TRICH WET PREP: NONE SEEN

## 2014-10-29 LAB — CBC WITH DIFFERENTIAL/PLATELET
BASOS ABS: 0 10*3/uL (ref 0.0–0.1)
BASOS PCT: 0 % (ref 0–1)
EOS PCT: 0 % (ref 0–5)
Eosinophils Absolute: 0.1 10*3/uL (ref 0.0–0.7)
HEMATOCRIT: 41.7 % (ref 36.0–46.0)
Hemoglobin: 13.8 g/dL (ref 12.0–15.0)
Lymphocytes Relative: 16 % (ref 12–46)
Lymphs Abs: 2 10*3/uL (ref 0.7–4.0)
MCH: 30.7 pg (ref 26.0–34.0)
MCHC: 33.1 g/dL (ref 30.0–36.0)
MCV: 92.9 fL (ref 78.0–100.0)
Monocytes Absolute: 0.6 10*3/uL (ref 0.1–1.0)
Monocytes Relative: 4 % (ref 3–12)
NEUTROS ABS: 9.8 10*3/uL — AB (ref 1.7–7.7)
Neutrophils Relative %: 80 % — ABNORMAL HIGH (ref 43–77)
Platelets: 268 10*3/uL (ref 150–400)
RBC: 4.49 MIL/uL (ref 3.87–5.11)
RDW: 12.6 % (ref 11.5–15.5)
WBC: 12.4 10*3/uL — ABNORMAL HIGH (ref 4.0–10.5)

## 2014-10-29 LAB — PREGNANCY, URINE: Preg Test, Ur: NEGATIVE

## 2014-10-29 MED ORDER — SODIUM CHLORIDE 0.9 % IV BOLUS (SEPSIS)
1000.0000 mL | Freq: Once | INTRAVENOUS | Status: AC
  Administered 2014-10-29: 1000 mL via INTRAVENOUS

## 2014-10-29 MED ORDER — KETOROLAC TROMETHAMINE 30 MG/ML IJ SOLN
30.0000 mg | Freq: Once | INTRAMUSCULAR | Status: AC
  Administered 2014-10-29: 30 mg via INTRAVENOUS
  Filled 2014-10-29: qty 1

## 2014-10-29 NOTE — ED Notes (Signed)
Bed: [id number] Expected date:  Expected time:  Means of arrival:  Comments: 28 y/o F abd pain

## 2014-10-29 NOTE — ED Notes (Signed)
Patient transported to Ultrasound 

## 2014-10-29 NOTE — Discharge Instructions (Signed)
You have a hemorrhagic cyst on your right ovary that is causing your abdominal pain.  Follow up with your OBGYN to ensure resolution.     Ovarian Cyst An ovarian cyst is a fluid-filled sac that forms on an ovary. The ovaries are small organs that produce eggs in women. Various types of cysts can form on the ovaries. Most are not cancerous. Many do not cause problems, and they often go away on their own. Some may cause symptoms and require treatment. Common types of ovarian cysts include:  Functional cysts--These cysts may occur every month during the menstrual cycle. This is normal. The cysts usually go away with the next menstrual cycle if the woman does not get pregnant. Usually, there are no symptoms with a functional cyst.  Endometrioma cysts--These cysts form from the tissue that lines the uterus. They are also called "chocolate cysts" because they become filled with blood that turns brown. This type of cyst can cause pain in the lower abdomen during intercourse and with your menstrual period.  Cystadenoma cysts--This type develops from the cells on the outside of the ovary. These cysts can get very big and cause lower abdomen pain and pain with intercourse. This type of cyst can twist on itself, cut off its blood supply, and cause severe pain. It can also easily rupture and cause a lot of pain.  Dermoid cysts--This type of cyst is sometimes found in both ovaries. These cysts may contain different kinds of body tissue, such as skin, teeth, hair, or cartilage. They usually do not cause symptoms unless they get very big.  Theca lutein cysts--These cysts occur when too much of a certain hormone (human chorionic gonadotropin) is produced and overstimulates the ovaries to produce an egg. This is most common after procedures used to assist with the conception of a baby (in vitro fertilization). CAUSES   Fertility drugs can cause a condition in which multiple large cysts are formed on the ovaries.  This is called ovarian hyperstimulation syndrome.  A condition called polycystic ovary syndrome can cause hormonal imbalances that can lead to nonfunctional ovarian cysts. SIGNS AND SYMPTOMS  Many ovarian cysts do not cause symptoms. If symptoms are present, they may include:  Pelvic pain or pressure.  Pain in the lower abdomen.  Pain during sexual intercourse.  Increasing girth (swelling) of the abdomen.  Abnormal menstrual periods.  Increasing pain with menstrual periods.  Stopping having menstrual periods without being pregnant. DIAGNOSIS  These cysts are commonly found during a routine or annual pelvic exam. Tests may be ordered to find out more about the cyst. These tests may include:  Ultrasound.  X-ray of the pelvis.  CT scan.  MRI.  Blood tests. TREATMENT  Many ovarian cysts go away on their own without treatment. Your health care provider may want to check your cyst regularly for 2-3 months to see if it changes. For women in menopause, it is particularly important to monitor a cyst closely because of the higher rate of ovarian cancer in menopausal women. When treatment is needed, it may include any of the following:  A procedure to drain the cyst (aspiration). This may be done using a long needle and ultrasound. It can also be done through a laparoscopic procedure. This involves using a thin, lighted tube with a tiny camera on the end (laparoscope) inserted through a small incision.  Surgery to remove the whole cyst. This may be done using laparoscopic surgery or an open surgery involving a larger incision in  the lower abdomen.  Hormone treatment or birth control pills. These methods are sometimes used to help dissolve a cyst. HOME CARE INSTRUCTIONS   Only take over-the-counter or prescription medicines as directed by your health care provider.  Follow up with your health care provider as directed.  Get regular pelvic exams and Pap tests. SEEK MEDICAL CARE IF:     Your periods are late, irregular, or painful, or they stop.  Your pelvic pain or abdominal pain does not go away.  Your abdomen becomes larger or swollen.  You have pressure on your bladder or trouble emptying your bladder completely.  You have pain during sexual intercourse.  You have feelings of fullness, pressure, or discomfort in your stomach.  You lose weight for no apparent reason.  You feel generally ill.  You become constipated.  You lose your appetite.  You develop acne.  You have an increase in body and facial hair.  You are gaining weight, without changing your exercise and eating habits.  You think you are pregnant. SEEK IMMEDIATE MEDICAL CARE IF:   You have increasing abdominal pain.  You feel sick to your stomach (nauseous), and you throw up (vomit).  You develop a fever that comes on suddenly.  You have abdominal pain during a bowel movement.  Your menstrual periods become heavier than usual. MAKE SURE YOU:  Understand these instructions.  Will watch your condition.  Will get help right away if you are not doing well or get worse. Document Released: 10/30/2005 Document Revised: 11/04/2013 Document Reviewed: 07/07/2013 [facility] Patient Information 2015 [place], [place]. This information is not intended to replace advice given to you by your health care provider. Make sure you discuss any questions you have with your health care provider.

## 2014-10-29 NOTE — ED Provider Notes (Signed)
CSN: [id number]     Arrival date & time 10/29/14  1528 History   First MD Initiated Contact with Patient 10/29/14 1535     Chief Complaint  Patient presents with  . Abdominal Pain     Patient is a 28 y.o. female presenting with abdominal pain. The history is provided by the patient.  Abdominal Pain  Patient here for evaluation of lower abdominal pain. She is 15 weeks postpartum following a cesarean delivery. 2 days ago she developed pain in her lower abdomen described as a severe pain that is constant in nature. Pain is worse with movement. She states it also feels like she has to pass gas. Her pain became much more worse about an hour prior to arrival. She has one of episode emesis. She denies fevers. She states she has some dysuria. There is a slight vaginal discharge. No constipation or diarrhea. She denies any medical problems. Symptoms are severe and worsening.  Past Medical History  Diagnosis Date  . Medical history non-contributory   . Diabetes mellitus without complication     GESTATIONAL  . Headache(784.0)     history of migraines no problem in 4 years  . History of surgical site infection   . PONV (postoperative nausea and vomiting)   . Hidradenitis suppurativa    Past Surgical History  Procedure Laterality Date  . Cesarean section    . Cesarean section N/A 07/17/2014    Procedure: CESAREAN SECTION;  Surgeon: Lavina Hammanodd Meisinger, MD;  Location: [facility];  Service: Obstetrics;  Laterality: N/A;   History reviewed. No pertinent family history. History  Substance Use Topics  . Smoking status: Current Every Day Smoker -- 0.50 packs/day for 10 years    Types: Cigarettes  . Smokeless tobacco: Never Used     Comment: Vapor cigarettes  . Alcohol Use: No   OB History    Gravida Para Term Preterm AB TAB SAB Ectopic Multiple Living   2 2 1 1      2      Review of Systems  Gastrointestinal: Positive for abdominal pain.  All other systems reviewed and are  negative.     Allergies  Amoxicillin and Penicillins  Home Medications   Prior to Admission medications   Medication Sig Start Date End Date Taking? Authorizing Provider  acetaminophen (TYLENOL) 325 MG tablet Take 650 mg by mouth every 6 (six) hours as needed for mild pain.   Yes Historical Provider, MD  ibuprofen (ADVIL,MOTRIN) 200 MG tablet Take 400 mg by mouth every 6 (six) hours as needed for moderate pain (pain).   Yes Historical Provider, MD  ibuprofen (ADVIL,MOTRIN) 600 MG tablet Take 1 tablet (600 mg total) by mouth every 6 (six) hours as needed for mild pain. 07/19/14  Yes Bing Plumehomas F Henley, MD  Simethicone (GAS-X PO) Take 1 tablet by mouth daily as needed (indigestion).   Yes Historical Provider, MD  calcium carbonate (TUMS - DOSED IN MG ELEMENTAL CALCIUM) 500 MG chewable tablet Chew 2 tablets by mouth daily as needed for indigestion or heartburn.    Historical Provider, MD   BP 135/86 mmHg  Pulse 69  Temp(Src) 98 F (36.7 C) (Oral)  Resp 17  SpO2 100%  LMP 10/13/2014 Physical Exam  Constitutional: She is oriented to person, place, and time. She appears well-developed and well-nourished.  HENT:  Head: Normocephalic and atraumatic.  Cardiovascular: Normal rate and regular rhythm.   No murmur heard. Pulmonary/Chest: Effort normal and breath sounds normal. No respiratory distress.  Abdominal: Soft. There is no rebound and no guarding.  Moderate lower abdominal tenderness without guarding or rebound.  Genitourinary:  Scant vaginal discharge. No CMT. Moderate diffuse tenderness.  Musculoskeletal: She exhibits no edema or tenderness.  Neurological: She is alert and oriented to person, place, and time.  Skin: Skin is warm and dry.  Psychiatric: She has a normal mood and affect. Her behavior is normal.  Nursing note and vitals reviewed.   ED Course  Procedures (including critical care time) Labs Review Labs Reviewed  WET PREP, GENITAL - Abnormal; Notable for the  following:    Yeast Wet Prep HPF POC RARE (*)    Clue Cells Wet Prep HPF POC FEW (*)    WBC, Wet Prep HPF POC FEW (*)    All other components within normal limits  CBC WITH DIFFERENTIAL - Abnormal; Notable for the following:    WBC 12.4 (*)    Neutrophils Relative % 80 (*)    Neutro Abs 9.8 (*)    All other components within normal limits  COMPREHENSIVE METABOLIC PANEL - Abnormal; Notable for the following:    Potassium 3.4 (*)    Glucose, Bld 138 (*)    All other components within normal limits  URINALYSIS, ROUTINE W REFLEX MICROSCOPIC - Abnormal; Notable for the following:    APPearance CLOUDY (*)    pH 8.5 (*)    Hgb urine dipstick SMALL (*)    Ketones, ur 15 (*)    Protein, ur 30 (*)    All other components within normal limits  URINE MICROSCOPIC-ADD ON - Abnormal; Notable for the following:    Casts HYALINE CASTS (*)    All other components within normal limits  GC/CHLAMYDIA PROBE AMP  PREGNANCY, URINE    Imaging Review US Transvaginal Non-ob  10/29/2014   CLINICAL DATA:  Suprapubic pain.  Fifteen weeks postpartum.  EXAM: TRANSABDOMINAL AND TRANSVAGINAL ULTRASOUND OF PELVIS  DOPPLER ULTRASOUND OF OVARIES  TECHNIQUE: Both transabdominal and transvaginal ultrasound examinations of the pelvis were performed. Transabdominal technique was performed for global imaging of the pelvis including uterus, ovaries, adnexal regions, and pelvic cul-de-sac.  It was necessary to proceed with endovaginal exam following the transabdominal exam to visualize the uterus, ovaries, and adnexa. Color and duplex Doppler ultrasound was utilized to evaluate blood flow to the ovaries.  COMPARISON:  None.  FINDINGS: Uterus  Measurements: 8.7 x 3.8 x 4.8 cm. No fibroids or other mass visualized.  Endometrium  Thickness: Normal, 7 mm.  No focal abnormality visualized.  Right ovary  Measurements: 5.1 x 3.9 x 4.4 cm. A 3.4 x 2.5 x 3.8 cm lesion demonstrates heterogeneous low-level echoes throughout. No vascular  components.  Left ovary  Measurements: 2.7 x 2.2 x 1.7 cm. Normal appearance/no adnexal mass.  Pulsed Doppler evaluation of both ovaries demonstrates normal low-resistance arterial and venous waveforms.  Other findings  Small to moderate volume complex fluid within the cul-de-sac. Example image 83.  IMPRESSION: 1. Right ovarian lesion which is most consistent with a hemorrhagic cyst. 2. Small to moderate volume complex fluid within the cul-de-sac. Likely related to recent cyst rupture. 3. No evidence of ovarian or adnexal torsion.   Electronically Signed   By: Jeronimo Greaves M.D.   On: 10/29/2014 19:01   US Pelvis Complete  10/29/2014   CLINICAL DATA:  Suprapubic pain.  Fifteen weeks postpartum.  EXAM: TRANSABDOMINAL AND TRANSVAGINAL ULTRASOUND OF PELVIS  DOPPLER ULTRASOUND OF OVARIES  TECHNIQUE: Both transabdominal and transvaginal ultrasound examinations of the  pelvis were performed. Transabdominal technique was performed for global imaging of the pelvis including uterus, ovaries, adnexal regions, and pelvic cul-de-sac.  It was necessary to proceed with endovaginal exam following the transabdominal exam to visualize the uterus, ovaries, and adnexa. Color and duplex Doppler ultrasound was utilized to evaluate blood flow to the ovaries.  COMPARISON:  None.  FINDINGS: Uterus  Measurements: 8.7 x 3.8 x 4.8 cm. No fibroids or other mass visualized.  Endometrium  Thickness: Normal, 7 mm.  No focal abnormality visualized.  Right ovary  Measurements: 5.1 x 3.9 x 4.4 cm. A 3.4 x 2.5 x 3.8 cm lesion demonstrates heterogeneous low-level echoes throughout. No vascular components.  Left ovary  Measurements: 2.7 x 2.2 x 1.7 cm. Normal appearance/no adnexal mass.  Pulsed Doppler evaluation of both ovaries demonstrates normal low-resistance arterial and venous waveforms.  Other findings  Small to moderate volume complex fluid within the cul-de-sac. Example image 83.  IMPRESSION: 1. Right ovarian lesion which is most consistent  with a hemorrhagic cyst. 2. Small to moderate volume complex fluid within the cul-de-sac. Likely related to recent cyst rupture. 3. No evidence of ovarian or adnexal torsion.   Electronically Signed   By: Jeronimo GreavesKyle  Talbot M.D.   On: 10/29/2014 19:01   [place] Art/ven Flow Abd Pelv Doppler  10/29/2014   CLINICAL DATA:  Suprapubic pain.  Fifteen weeks postpartum.  EXAM: TRANSABDOMINAL AND TRANSVAGINAL ULTRASOUND OF PELVIS  DOPPLER ULTRASOUND OF OVARIES  TECHNIQUE: Both transabdominal and transvaginal ultrasound examinations of the pelvis were performed. Transabdominal technique was performed for global imaging of the pelvis including uterus, ovaries, adnexal regions, and pelvic cul-de-sac.  It was necessary to proceed with endovaginal exam following the transabdominal exam to visualize the uterus, ovaries, and adnexa. Color and duplex Doppler ultrasound was utilized to evaluate blood flow to the ovaries.  COMPARISON:  None.  FINDINGS: Uterus  Measurements: 8.7 x 3.8 x 4.8 cm. No fibroids or other mass visualized.  Endometrium  Thickness: Normal, 7 mm.  No focal abnormality visualized.  Right ovary  Measurements: 5.1 x 3.9 x 4.4 cm. A 3.4 x 2.5 x 3.8 cm lesion demonstrates heterogeneous low-level echoes throughout. No vascular components.  Left ovary  Measurements: 2.7 x 2.2 x 1.7 cm. Normal appearance/no adnexal mass.  Pulsed Doppler evaluation of both ovaries demonstrates normal low-resistance arterial and venous waveforms.  Other findings  Small to moderate volume complex fluid within the cul-de-sac. Example image 83.  IMPRESSION: 1. Right ovarian lesion which is most consistent with a hemorrhagic cyst. 2. Small to moderate volume complex fluid within the cul-de-sac. Likely related to recent cyst rupture. 3. No evidence of ovarian or adnexal torsion.   Electronically Signed   By: Jeronimo GreavesKyle  Talbot M.D.   On: 10/29/2014 19:01     EKG Interpretation None      MDM   Final diagnoses:  Pelvic pain in female   Hemorrhagic ovarian cyst    Patient here for evaluation of abdominal pain. Clinical picture not consistent with acute appendicitis. Pelvic exam not consistent with PID or cervicitis. Pelvic ultrasound with hemorrhagic cyst, this is likely contributed to patient's symptoms. Patient's pain is improved after Toradol. Discussed with patient findings of ovarian cyst and need for OB/GYN follow-up. After prescription for pain medications and patient declined.  Tilden FossaElizabeth Brandyce Dimario, MD 10/29/14 1932

## 2014-10-29 NOTE — ED Notes (Signed)
Suprapubic abd pain for the last two days. N/V x 1, no diarrhea. Slight burning with urination. Pt assumed it was gas pain, and states she has "been more gassy for the last two days."

## 2014-10-30 LAB — GC/CHLAMYDIA PROBE AMP
CT PROBE, AMP APTIMA: NEGATIVE
GC Probe RNA: NEGATIVE

## 2015-04-02 ENCOUNTER — Encounter (INDEPENDENT_AMBULATORY_CARE_PROVIDER_SITE_OTHER): Payer: Self-pay

## 2015-10-08 IMAGING — US US PELVIS COMPLETE
1 series · 13 of 25 positions shown · non-contrast
Comparison: None.

CLINICAL DATA: Suprapubic pain.  Fifteen weeks postpartum.

EXAM:
TRANSABDOMINAL AND TRANSVAGINAL ULTRASOUND OF PELVIS
DOPPLER ULTRASOUND OF OVARIES
TECHNIQUE: Both transabdominal and transvaginal ultrasound examinations of the
pelvis were performed. Transabdominal technique was performed for
global imaging of the pelvis including uterus, ovaries, adnexal
regions, and pelvic cul-de-sac.
It was necessary to proceed with endovaginal exam following the
transabdominal exam to visualize the uterus, ovaries, and adnexa.
Color and duplex Doppler ultrasound was utilized to evaluate blood
flow to the ovaries.

[Series 1: us pelvis complete · 0.21mm/px · 13 of 82 slices shown]
[im 1/82]
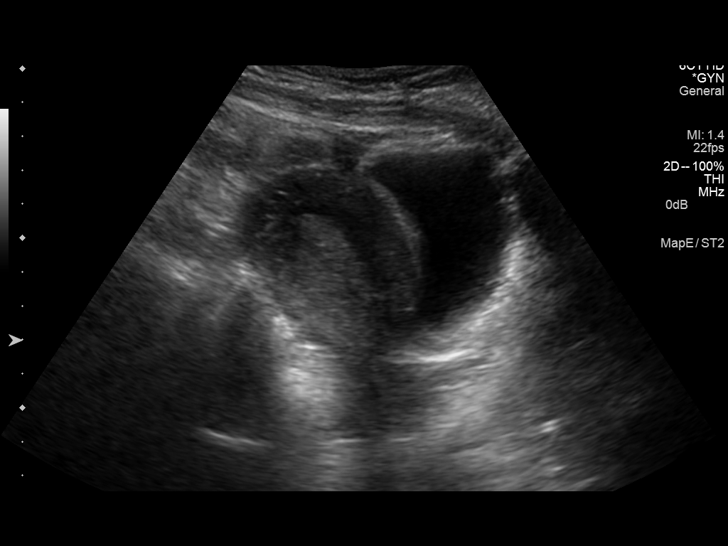
[im 7/82]
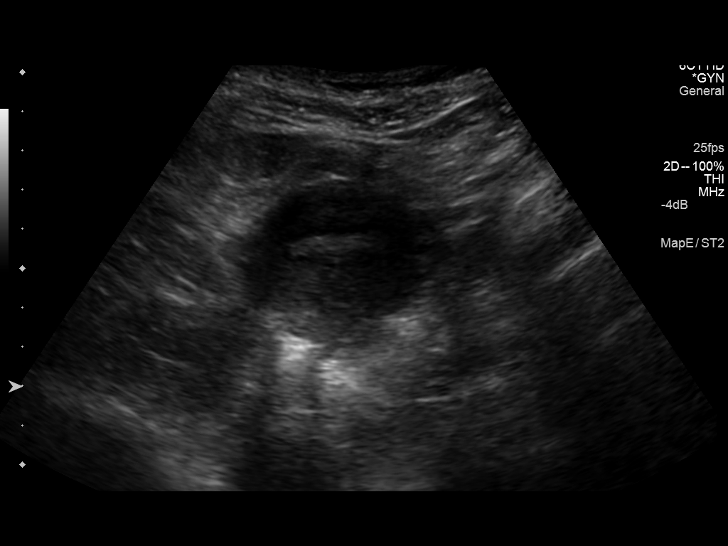
[im 14/82]
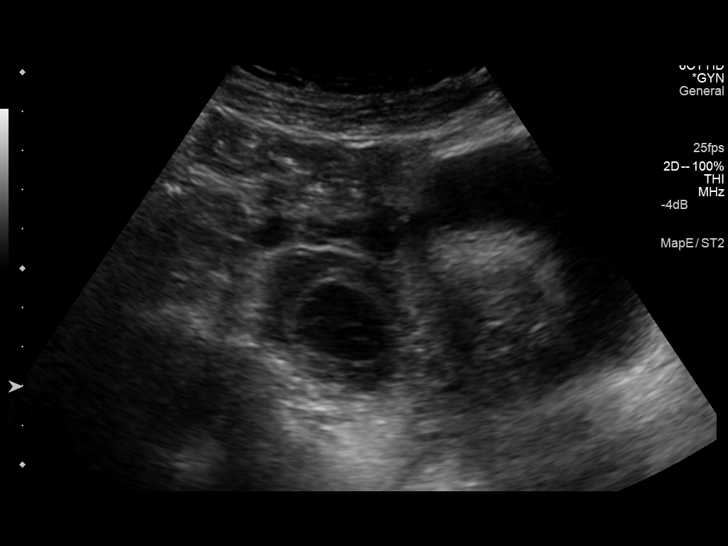
[im 21/82]
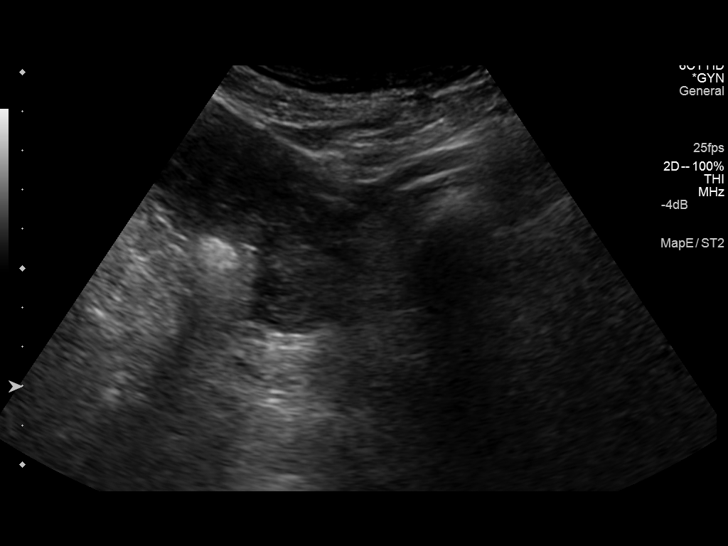
[im 28/82]
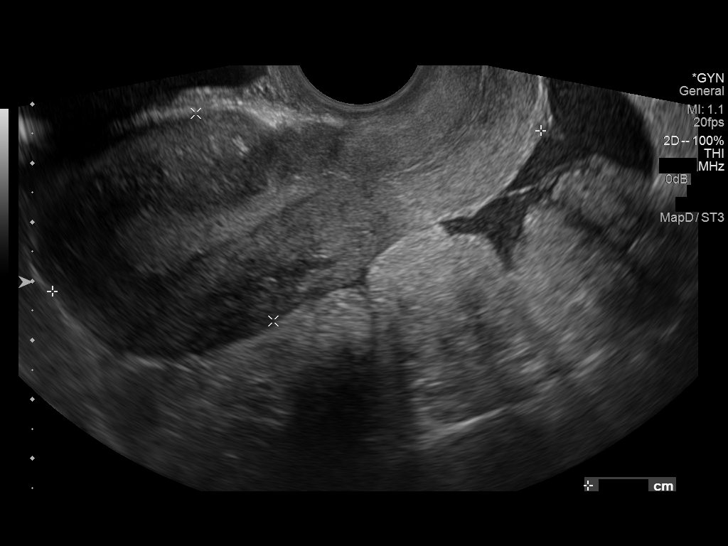
[im 34/82]
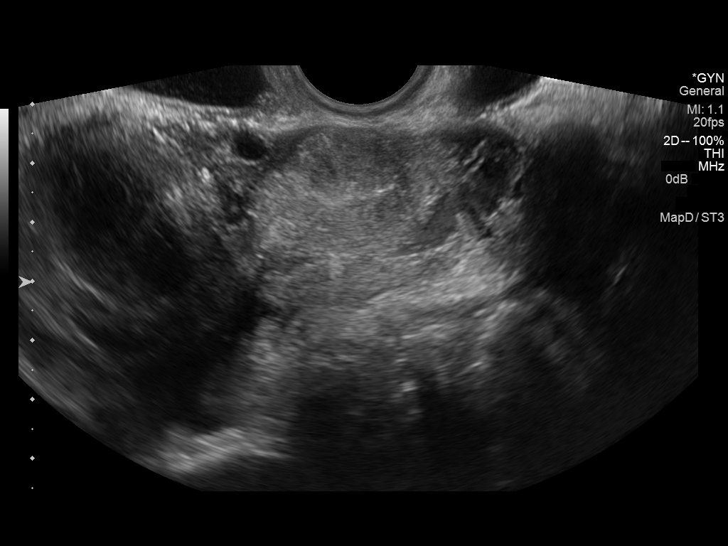
[im 41/82]
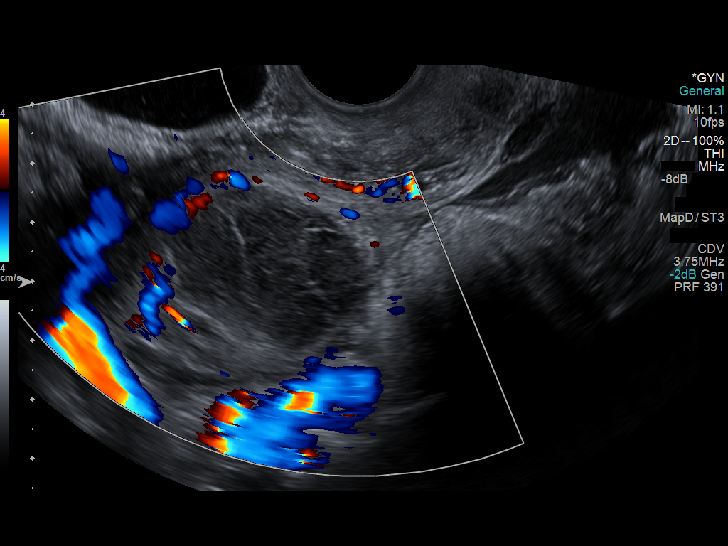
[im 48/82]
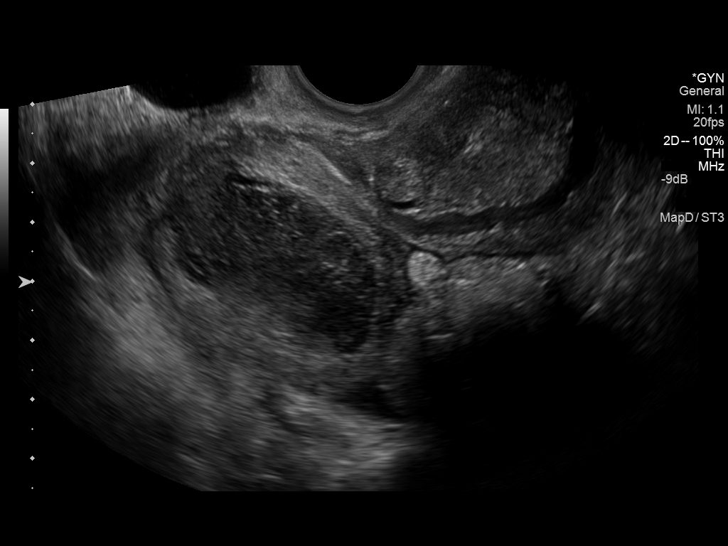
[im 55/82]
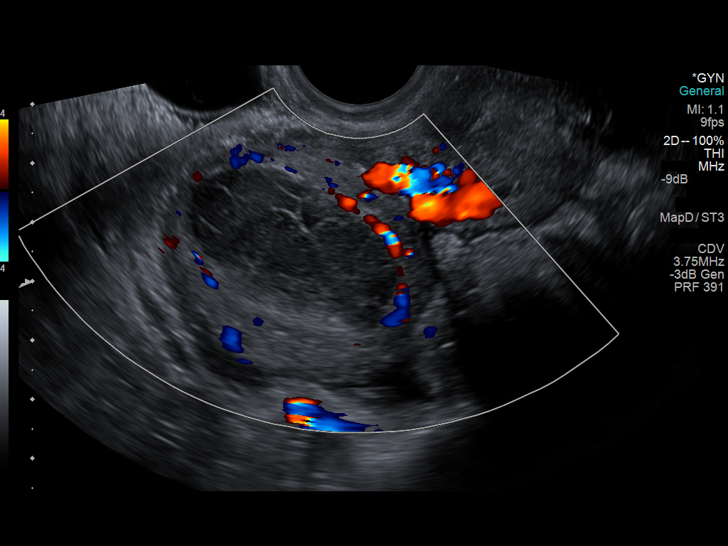
[im 61/82]
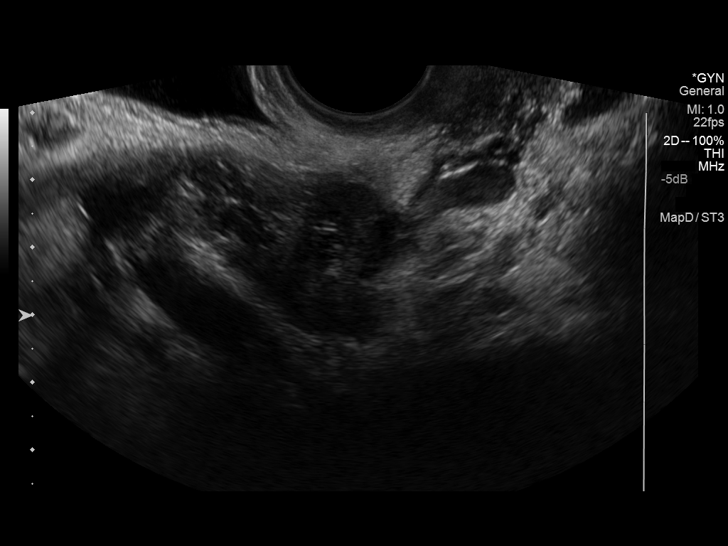
[im 68/82]
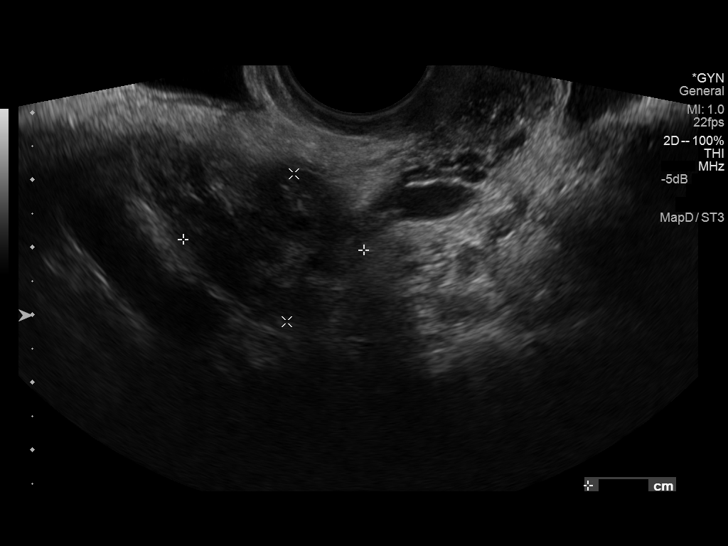
[im 75/82]
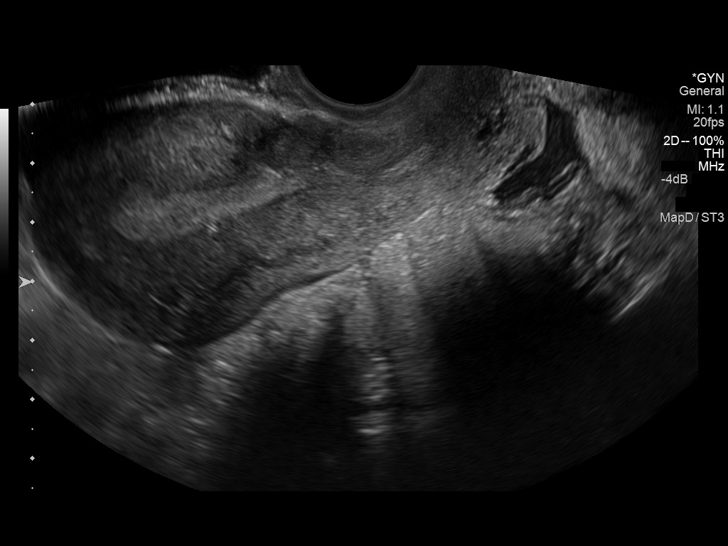
[im 82/82]
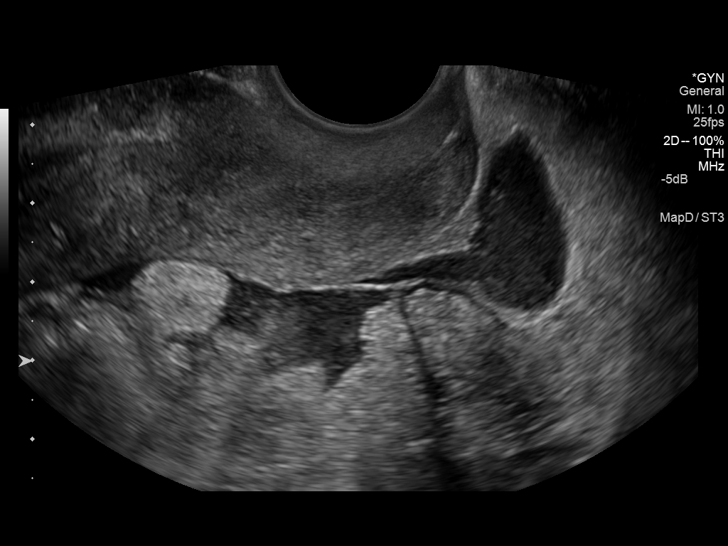

[13 of 25 positions shown; findings below may reference images not displayed]

FINDINGS: Uterus

Measurements: 8.7 x 3.8 x 4.8 cm.. No fibroids or other mass
visualized.

Endometrium

Thickness: Normal, 7 mm..  No focal abnormality visualized.

Right ovary

Measurements: 5.1 x 3.9 x 4.4 cm. A 3.4 x 2.5 x 3.8 cm lesion
demonstrates heterogeneous low-level echoes throughout. No vascular
components.

Left ovary

Measurements: 2.7 x 2.2 x 1.7 cm.. Normal appearance/no adnexal
mass.

Pulsed Doppler evaluation of both ovaries demonstrates normal
low-resistance arterial and venous waveforms.

Other findings

Small to moderate volume complex fluid within the cul-de-sac.
Example image 83.
IMPRESSION: 1. Right ovarian lesion which is most consistent with a hemorrhagic
cyst.
2. Small to moderate volume complex fluid within the cul-de-sac.
Likely related to recent cyst rupture.
3. No evidence of ovarian or adnexal torsion.
